# Patient Record
Sex: Male | Born: 1970
Health system: Southern US, Community
[De-identification: ages and names within clinical notes are randomized; demographics above are authoritative.]

## PROBLEM LIST (undated history)

## (undated) DIAGNOSIS — K5790 Diverticulosis of intestine, part unspecified, without perforation or abscess without bleeding: Secondary | ICD-10-CM

## (undated) DIAGNOSIS — T7840XA Allergy, unspecified, initial encounter: Secondary | ICD-10-CM

## (undated) DIAGNOSIS — K5900 Constipation, unspecified: Secondary | ICD-10-CM

## (undated) DIAGNOSIS — J45909 Unspecified asthma, uncomplicated: Secondary | ICD-10-CM

## (undated) DIAGNOSIS — K219 Gastro-esophageal reflux disease without esophagitis: Secondary | ICD-10-CM

## (undated) DIAGNOSIS — H269 Unspecified cataract: Secondary | ICD-10-CM

## (undated) DIAGNOSIS — Z83719 Family history of colon polyps, unspecified: Secondary | ICD-10-CM

## (undated) DIAGNOSIS — M1990 Unspecified osteoarthritis, unspecified site: Secondary | ICD-10-CM

## (undated) DIAGNOSIS — G709 Myoneural disorder, unspecified: Secondary | ICD-10-CM

## (undated) DIAGNOSIS — G5790 Unspecified mononeuropathy of unspecified lower limb: Secondary | ICD-10-CM

## (undated) DIAGNOSIS — Z8371 Family history of colonic polyps: Secondary | ICD-10-CM

## (undated) HISTORY — PX: EXTERNAL EAR SURGERY: SHX627

## (undated) HISTORY — PX: ANTERIOR CRUCIATE LIGAMENT REPAIR: SHX115

## (undated) HISTORY — DX: Family history of colonic polyps: Z83.71

## (undated) HISTORY — PX: OTHER SURGICAL HISTORY: SHX169

## (undated) HISTORY — DX: Myoneural disorder, unspecified: G70.9

## (undated) HISTORY — DX: Family history of colon polyps, unspecified: Z83.719

## (undated) HISTORY — DX: Allergy, unspecified, initial encounter: T78.40XA

## (undated) HISTORY — DX: Unspecified mononeuropathy of unspecified lower limb: G57.90

## (undated) HISTORY — PX: KNEE ARTHROSCOPY: SHX127

## (undated) HISTORY — DX: Unspecified cataract: H26.9

## (undated) HISTORY — DX: Unspecified asthma, uncomplicated: J45.909

## (undated) HISTORY — DX: Unspecified osteoarthritis, unspecified site: M19.90

## (undated) HISTORY — PX: COLONOSCOPY: SHX174

## (undated) HISTORY — DX: Gastro-esophageal reflux disease without esophagitis: K21.9

## (undated) HISTORY — DX: Constipation, unspecified: K59.00

## (undated) HISTORY — DX: Diverticulosis of intestine, part unspecified, without perforation or abscess without bleeding: K57.90

---

## 2017-03-27 ENCOUNTER — Ambulatory Visit (INDEPENDENT_AMBULATORY_CARE_PROVIDER_SITE_OTHER)
Admission: RE | Admit: 2017-03-27 | Discharge: 2017-03-27 | Disposition: A | Discharge status: Home / Self Care | Payer: No Typology Code available for payment source | Source: Ambulatory Visit | Attending: Family Medicine | Admitting: Family Medicine

## 2017-03-27 ENCOUNTER — Ambulatory Visit (INDEPENDENT_AMBULATORY_CARE_PROVIDER_SITE_OTHER): Payer: No Typology Code available for payment source | Admitting: Family Medicine

## 2017-03-27 ENCOUNTER — Encounter: Payer: Self-pay | Admitting: Family Medicine

## 2017-03-27 VITALS — BP 120/88 | HR 73 | Ht 73.0 in | Wt 244.0 lb

## 2017-03-27 DIAGNOSIS — M549 Dorsalgia, unspecified: Secondary | ICD-10-CM | POA: Diagnosis not present

## 2017-03-27 DIAGNOSIS — L309 Dermatitis, unspecified: Secondary | ICD-10-CM | POA: Insufficient documentation

## 2017-03-27 DIAGNOSIS — J45909 Unspecified asthma, uncomplicated: Secondary | ICD-10-CM | POA: Insufficient documentation

## 2017-03-27 DIAGNOSIS — M5416 Radiculopathy, lumbar region: Secondary | ICD-10-CM | POA: Diagnosis not present

## 2017-03-27 MED ORDER — GABAPENTIN 100 MG PO CAPS: 200.0000 mg | ORAL_CAPSULE | Freq: Every day | ORAL | 3 refills | Status: DC

## 2017-03-27 MED ORDER — KETOROLAC TROMETHAMINE 60 MG/2ML IM SOLN
60.0000 mg | Freq: Once | INTRAMUSCULAR | Status: AC
  Administered 2017-03-27: 60 mg via INTRAMUSCULAR

## 2017-03-27 MED ORDER — METHYLPREDNISOLONE ACETATE 80 MG/ML IJ SUSP
80.0000 mg | Freq: Once | INTRAMUSCULAR | Status: AC
  Administered 2017-03-27: 80 mg via INTRAMUSCULAR

## 2017-03-27 MED ORDER — PREDNISONE 50 MG PO TABS: 50.0000 mg | ORAL_TABLET | Freq: Every day | ORAL | 0 refills | Status: DC

## 2017-03-27 NOTE — Patient Instructions (Addendum)
Good to see you  My cocktail. 2 injection today  Ice 20 minutes 2 times daily. Usually after activity and before bed. Prednisone daily for 5 days starting tomorrow.  Gabapentin 200mg  at night  Back xrays today  Lets do Wed at 1245 or 1pm next week (ok to double book just show this to the front)

## 2017-03-27 NOTE — Progress Notes (Signed)
[name] [place], [place] [address] Phone: [phone] Subjective:    I'm seeing this patient by the request  of:    CC: Low back pain  [id number]  Kyle [name] is a 47 y.o. male coming in for chronic back pain. He has been told that he has DDD and bone spurs in his cervical spine. For the past week the right calf is sore and his foot will go numb. He thought that his truck was placing his back in an odd position. He does have hip and back pain. He has tried warm baths, Toradol, and prednisone.     Patient did have x-rays done today that were independently visualized by me.  Showed moderate degenerative disc disease at L3 through L5.    Past Medical History:  Diagnosis Date  . Arthritis   . Asthma   . GERD (gastroesophageal reflux disease)     Social History   Socioeconomic History  . Marital status: Single    Spouse name: Not on file  . Number of children: Not on file  . Years of education: Not on file  . Highest education level: Not on file  Social Needs  . Financial resource strain: Not on file  . Food insecurity - worry: Not on file  . Food insecurity - inability: Not on file  . Transportation needs - medical: Not on file  . Transportation needs - non-medical: Not on file  Occupational History  . Not on file  Tobacco Use  . Smoking status: Not on file  Substance and Sexual Activity  . Alcohol use: Not on file  . Drug use: Not on file  . Sexual activity: Not on file  Other Topics Concern  . Not on file  Social History Narrative  . Not on file   Allergies  Allergen Reactions  . Azithromycin Other (See Comments)    Possible elevated LFTs Elevated liver enzymes Possible elevated LFTs   . Cyclosporine Hives  . Meperidine Hives  . Penicillins Swelling    Neck swelling    No family history on file.  Family history of potential congenital spinal stenosis   Past medical history, social, surgical and family  history all reviewed in electronic medical record.  No pertanent information unless stated regarding to the chief complaint.   Review of Systems:Review of systems updated and as accurate as of 03/27/17  No headache, visual changes, nausea, vomiting, diarrhea, constipation, dizziness, abdominal pain, skin rash, fevers, chills, night sweats, weight loss, swollen lymph nodes, body aches, joint swelling, muscle aches, chest pain, shortness of breath, mood changes.   Objective  Blood pressure 120/88, pulse 73, height 6\' 1"  (1.854 m), weight 244 lb (110.7 kg), SpO2 94 %. Systems examined below as of 03/27/17   General: No apparent distress alert and oriented x3 mood and affect normal, dressed appropriately.  HEENT: Pupils equal, extraocular movements intact  Respiratory: Patient's speak in full sentences and does not appear short of breath  Cardiovascular: No lower extremity edema, non tender, no erythema  Skin: Warm dry intact with no signs of infection or rash on extremities or on axial skeleton.  Abdomen: Soft nontender  Neuro: Cranial nerves II through XII are intact, neurovascularly intact in all extremities with 2+ DTRs and 2+ pulses.  Lymph: No lymphadenopathy of posterior or anterior cervical chain or axillae bilaterally.  Gait normal with good balance and coordination.  MSK:  Non tender with full range of motion and  good stability and symmetric strength and tone of shoulders, elbows, wrist, hip, knee and ankles bilaterally.   Back Exam:  Inspection: Mild loss of lordosis Motion: Flexion 40 deg, Extension 25 deg, Side Bending to 45 deg bilaterally,  Rotation to 45 deg bilaterally  SLR laying: Mild positive right and 30 degrees XSLR laying: Negative  Palpable tenderness: Tenderness to palpation over the right sacroiliac joint. FABER: negative. Sensory change: Gross sensation intact to all lumbar and sacral dermatomes.  Reflexes: 2+ at both patellar tendons, 2+ at achilles tendons,  Babinski's downgoing.  Strength at foot  Plantar-flexion: 4/5 on the right compared to the contralateral side.  Dorsi-flexion: 5/5 Eversion: 5/5 Inversion: 5/5  Leg strength  Quad: 5/5 Hamstring: 5/5 Hip flexor: 5/5 Hip abductors: 5/5  Gait unremarkable.    Impression and Recommendations:     This case required medical decision making of moderate complexity.      Note: This dictation was prepared with Dragon dictation along with smaller phrase technology. Any transcriptional errors that result from this process are unintentional.

## 2017-03-27 NOTE — Assessment & Plan Note (Signed)
Patient does have more of a lumbar radiculopathy.  Discussed with patient.  Given Toradol and Depo-Medrol today.  Prednisone given for the next 5 days.  Discussed icing regimen and home exercises.  Follow-up in 1 week.  Worsening symptoms consider advanced imaging.  Improvement patient could be a candidate for osteopathic manipulation.

## 2017-04-01 ENCOUNTER — Ambulatory Visit (INDEPENDENT_AMBULATORY_CARE_PROVIDER_SITE_OTHER): Payer: No Typology Code available for payment source | Admitting: Family Medicine

## 2017-04-01 ENCOUNTER — Encounter: Payer: Self-pay | Admitting: Family Medicine

## 2017-04-01 DIAGNOSIS — M5416 Radiculopathy, lumbar region: Secondary | ICD-10-CM | POA: Diagnosis not present

## 2017-04-01 DIAGNOSIS — M999 Biomechanical lesion, unspecified: Secondary | ICD-10-CM | POA: Diagnosis not present

## 2017-04-01 MED ORDER — CYCLOBENZAPRINE HCL 5 MG PO TABS: 5.0000 mg | ORAL_TABLET | Freq: Three times a day (TID) | ORAL | 3 refills | Status: DC | PRN

## 2017-04-01 NOTE — Progress Notes (Signed)
error 

## 2017-04-01 NOTE — Assessment & Plan Note (Signed)
Decision today to treat with OMT was based on Physical Exam  After verbal consent patient was treated with HVLA, ME, FPR techniques in cervical, thoracic, lumbar and sacral areas  Patient tolerated the procedure well with improvement in symptoms  Patient given exercises, stretches and lifestyle modifications  See medications in patient instructions if given  Patient will follow up in 3-4 weeks  

## 2017-04-01 NOTE — Progress Notes (Signed)
[name] [place], [place] [address] Phone: [phone] Subjective:     CC: Back pain follow-up  [id number]  Kyle [name] is a 47 y.o. male coming in with complaint of neck pain.  Was found to have some mild lumbar radiculopathy.  Patient was given prednisone as well as gabapentin.  Patient states doing much better but still having radicular symptoms down the leg.  Patient states on the right has noticed that it is not as frequent is not as severe.  Still seems to be in a flexed position that makes it worse.  Still fatigue at the end of the day seems to make it worse as well.    X-rays taken.  Did have degenerative disc disease from L3 through L5.  Past Medical History:  Diagnosis Date  . Arthritis   . Asthma   . GERD (gastroesophageal reflux disease)    Past Surgical History:  Procedure Laterality Date  . ANTERIOR CRUCIATE LIGAMENT REPAIR    . EXTERNAL EAR SURGERY    . KNEE ARTHROSCOPY     Social History   Socioeconomic History  . Marital status: Single    Spouse name: None  . Number of children: None  . Years of education: None  . Highest education level: None  Social Needs  . Financial resource strain: None  . Food insecurity - worry: None  . Food insecurity - inability: None  . Transportation needs - medical: None  . Transportation needs - non-medical: None  Occupational History  . None  Tobacco Use  . Smoking status: None  Substance and Sexual Activity  . Alcohol use: None  . Drug use: None  . Sexual activity: None  Other Topics Concern  . None  Social History Narrative  . None   Allergies  Allergen Reactions  . Azithromycin Other (See Comments)    Possible elevated LFTs Elevated liver enzymes Possible elevated LFTs   . Cyclosporine Hives  . Meperidine Hives  . Penicillins Swelling    Neck swelling    History reviewed. No pertinent family history.  Some family history of congenital  stenosis   Past medical history, social, surgical and family history all reviewed in electronic medical record.  No pertanent information unless stated regarding to the chief complaint.   Review of Systems:Review of systems updated and as accurate as of 04/01/17  No headache, visual changes, nausea, vomiting, diarrhea, constipation, dizziness, abdominal pain, skin rash, fevers, chills, night sweats, weight loss, swollen lymph nodes, body aches, joint swelling, muscle aches, chest pain, shortness of breath, mood changes.   Objective  Blood pressure 118/82, pulse 72, height 6\' 1"  (1.854 m), weight 245 lb (111.1 kg), SpO2 96 %. Systems examined below as of 04/01/17   General: No apparent distress alert and oriented x3 mood and affect normal, dressed appropriately.  HEENT: Pupils equal, extraocular movements intact  Respiratory: Patient's speak in full sentences and does not appear short of breath  Cardiovascular: No lower extremity edema, non tender, no erythema  Skin: Warm dry intact with no signs of infection or rash on extremities or on axial skeleton.  Abdomen: Soft nontender  Neuro: Cranial nerves II through XII are intact, neurovascularly intact in all extremities with 2+ DTRs and 2+ pulses.  Lymph: No lymphadenopathy of posterior or anterior cervical chain or axillae bilaterally.  Gait normal with good balance and coordination.  MSK:  Non tender with full range of motion and good stability and symmetric strength  and tone of shoulders, elbows, wrist, hip, knee and ankles bilaterally.  Back Exam:  Inspection: Mild loss of lordosis Motion: Flexion 40 deg, Extension 20 deg, Side Bending to 35 deg bilaterally,  Rotation to 45 deg bilaterally  SLR laying: Mild positive on the right XSLR laying: Negative  Palpable tenderness: Tender to palpation the paraspinal musculature of lumbar spine right greater than left. FABER: negative. Sensory change: Gross sensation intact to all lumbar and  sacral dermatomes.  Reflexes: 2+ at both patellar tendons, 2+ at achilles tendons, Babinski's downgoing.  Strength at foot  Plantar-flexion: 5/5 Dorsi-flexion: 5/5 Eversion: 5/5 Inversion: 5/5  Leg strength  Quad: 5/5 Hamstring: 5/5 Hip flexor: 5/5 Hip abductors: 5/5  Gait unremarkable.   Osteopathic findings C2 flexed rotated and side bent right C7 flexed rotated and side bent left T3 extended rotated and side bent right inhaled third rib T5 extended rotated and side bent left L3 flexed rotated and side bent right Sacrum right on right    Impression and Recommendations:     This case required medical decision making of moderate complexity.      Note: This dictation was prepared with Dragon dictation along with smaller phrase technology. Any transcriptional errors that result from this process are unintentional.

## 2017-04-01 NOTE — Patient Instructions (Signed)
Good to see you  Kyle [name] is your friend.  Exercises 3 times a week.  Give the bowling one week  Gabapentin 200mg  at night Refilled flexeril  See me again in 3-4 weeks

## 2017-04-01 NOTE — Assessment & Plan Note (Signed)
Patient did have more of a lumbar radiculopathy.  His doing significantly better.  Still a mild positive straight leg test.  Attempted osteopathic manipulation.  Patient has responded to this fairly well today.  We discussed icing regimen, home exercises, can gabapentin.  Patient will follow up with me again in 3-4 weeks

## 2017-04-26 NOTE — Progress Notes (Signed)
[name] [place], [place] [address] Phone: [phone] Subjective:     CC: Back pain follow-up  [id number]  Kyle [name] is a 47 y.o. male coming in with complaint of back pain.  Past medical history significant for back surgeries.  Patient was having more radicular symptoms.  Responded well to prednisone and then osteopathic manipulation.  Patient has been doing home exercises.  Patient states continues to improve.  Patient states that he is 90% better.  Still if he sits a long amount of time has some intermittent pain that goes down the leg.  Nothing severe as what it was before.  Increasing activity.      Past Medical History:  Diagnosis Date  . Arthritis   . Asthma   . GERD (gastroesophageal reflux disease)    Past Surgical History:  Procedure Laterality Date  . ANTERIOR CRUCIATE LIGAMENT REPAIR    . EXTERNAL EAR SURGERY    . KNEE ARTHROSCOPY     Social History   Socioeconomic History  . Marital status: Single    Spouse name: None  . Number of children: None  . Years of education: None  . Highest education level: None  Social Needs  . Financial resource strain: None  . Food insecurity - worry: None  . Food insecurity - inability: None  . Transportation needs - medical: None  . Transportation needs - non-medical: None  Occupational History  . None  Tobacco Use  . Smoking status: Never Smoker  . Smokeless tobacco: Never Used  Substance and Sexual Activity  . Alcohol use: None  . Drug use: None  . Sexual activity: None  Other Topics Concern  . None  Social History Narrative  . None   Allergies  Allergen Reactions  . Azithromycin Other (See Comments)    Possible elevated LFTs Elevated liver enzymes Possible elevated LFTs   . Cyclosporine Hives  . Meperidine Hives  . Penicillins Swelling    Neck swelling    No family history on file.  No family history of autoimmune   Past medical history, social,  surgical and family history all reviewed in electronic medical record.  No pertanent information unless stated regarding to the chief complaint.   Review of Systems:Review of systems updated and as accurate as of 04/27/17  No headache, visual changes, nausea, vomiting, diarrhea, constipation, dizziness, abdominal pain, skin rash, fevers, chills, night sweats, weight loss, swollen lymph nodes, body aches, joint swelling, muscle aches, chest pain, shortness of breath, mood changes.   Objective  Blood pressure 118/80, pulse 64, height 6\' 1"  (1.854 m), weight 242 lb (109.8 kg), SpO2 97 %. Systems examined below as of 04/27/17   General: No apparent distress alert and oriented x3 mood and affect normal, dressed appropriately.  HEENT: Pupils equal, extraocular movements intact  Respiratory: Patient's speak in full sentences and does not appear short of breath  Cardiovascular: No lower extremity edema, non tender, no erythema  Skin: Warm dry intact with no signs of infection or rash on extremities or on axial skeleton.  Abdomen: Soft nontender  Neuro: Cranial nerves II through XII are intact, neurovascularly intact in all extremities with 2+ DTRs and 2+ pulses.  Lymph: No lymphadenopathy of posterior or anterior cervical chain or axillae bilaterally.  Gait normal with good balance and coordination.  MSK:  Non tender with full range of motion and good stability and symmetric strength and tone of shoulders, elbows, wrist, hip, knee and ankles  bilaterally.  Back Exam:  Inspection: Mild loss of lordosis Motion: Flexion 45 deg, Extension 20 deg, Side Bending to 30 deg bilaterally,  Rotation to 40 deg bilaterally  SLR laying: Negative  XSLR laying: Negative  Palpable tenderness: Tender to palpation in the paraspinal musculature. FABER: negative. Sensory change: Gross sensation intact to all lumbar and sacral dermatomes.  Reflexes: 2+ at both patellar tendons, 2+ at achilles tendons, Babinski's  downgoing.  Strength at foot  Plantar-flexion: 5/5 Dorsi-flexion: 5/5 Eversion: 5/5 Inversion: 5/5  Leg strength  Quad: 5/5 Hamstring: 5/5 Hip flexor: 5/5 Hip abductors: 4/5 but symmetric Gait unremarkable.  Osteopathic findings C2 flexed rotated and side bent right T5 extended rotated and side bent left L2 flexed rotated and side bent left  Sacrum right on right     Impression and Recommendations:     This case required medical decision making of moderate complexity.      Note: This dictation was prepared with Dragon dictation along with smaller phrase technology. Any transcriptional errors that result from this process are unintentional.

## 2017-04-27 ENCOUNTER — Ambulatory Visit (INDEPENDENT_AMBULATORY_CARE_PROVIDER_SITE_OTHER): Payer: No Typology Code available for payment source | Admitting: Family Medicine

## 2017-04-27 ENCOUNTER — Encounter: Payer: Self-pay | Admitting: Family Medicine

## 2017-04-27 VITALS — BP 118/80 | HR 64 | Ht 73.0 in | Wt 242.0 lb

## 2017-04-27 DIAGNOSIS — M5416 Radiculopathy, lumbar region: Secondary | ICD-10-CM | POA: Diagnosis not present

## 2017-04-27 DIAGNOSIS — M999 Biomechanical lesion, unspecified: Secondary | ICD-10-CM

## 2017-04-27 NOTE — Assessment & Plan Note (Signed)
Decision today to treat with OMT was based on Physical Exam  After verbal consent patient was treated with HVLA, ME, FPR techniques in cervical, thoracic, lumbar and sacral areas  Patient tolerated the procedure well with improvement in symptoms  Patient given exercises, stretches and lifestyle modifications  See medications in patient instructions if given  Patient will follow up in 4 weeks 

## 2017-04-27 NOTE — Assessment & Plan Note (Signed)
Much improved at this time.  Discussed icing regimen and home exercises.  Discussed which activities to do which wants to avoid.  Patient is to increase activity slowly over the course the next several days.  Follow-up with me again in 4-6 weeks.

## 2017-04-27 NOTE — Patient Instructions (Signed)
6-8 week

## 2017-06-17 NOTE — Progress Notes (Signed)
[name] [place], [place] [address] Phone: [phone] Subjective:     CC: Back pain follow-up  [name]  Kyle [name] is a 47 y.o. male coming in with complaint of back pain.  Patient was found to have more of a lumbar radiculopathy.  Patient wanted to continue conservative therapy.  Seems to be doing okay.  Still has intermittent pain that seems to go down the right leg from time to time.  Nothing severe though.  Patient states that the back pain can sometimes get fairly aggravated O.     Past Medical History:  Diagnosis Date  . Arthritis   . Asthma   . GERD (gastroesophageal reflux disease)    Past Surgical History:  Procedure Laterality Date  . ANTERIOR CRUCIATE LIGAMENT REPAIR    . EXTERNAL EAR SURGERY    . KNEE ARTHROSCOPY     Social History   Socioeconomic History  . Marital status: Single    Spouse name: Not on file  . Number of children: Not on file  . Years of education: Not on file  . Highest education level: Not on file  Occupational History  . Not on file  Social Needs  . Financial resource strain: Not on file  . Food insecurity:    Worry: Not on file    Inability: Not on file  . Transportation needs:    Medical: Not on file    Non-medical: Not on file  Tobacco Use  . Smoking status: Never Smoker  . Smokeless tobacco: Never Used  Substance and Sexual Activity  . Alcohol use: Not on file  . Drug use: Not on file  . Sexual activity: Not on file  Lifestyle  . Physical activity:    Days per week: Not on file    Minutes per session: Not on file  . Stress: Not on file  Relationships  . Social connections:    Talks on phone: Not on file    Gets together: Not on file    Attends religious service: Not on file    Active member of club or organization: Not on file    Attends meetings of clubs or organizations: Not on file    Relationship status: Not on file  Other Topics Concern  . Not on file    Social History Narrative  . Not on file   Allergies  Allergen Reactions  . Azithromycin Other (See Comments)    Possible elevated LFTs Elevated liver enzymes Possible elevated LFTs   . Cyclosporine Hives  . Meperidine Hives  . Penicillins Swelling    Neck swelling    No family history on file.   Past medical history, social, surgical and family history all reviewed in electronic medical record.  No pertanent information unless stated regarding to the chief complaint.   Review of Systems:Review of systems updated and as accurate as of 06/18/17  No headache, visual changes, nausea, vomiting, diarrhea, constipation, dizziness, abdominal pain, skin rash, fevers, chills, night sweats, weight loss, swollen lymph nodes, body aches, joint swelling,  chest pain, shortness of breath, mood changes.  Positive muscle aches  Objective  Blood pressure 130/84, pulse 83, height 6\' 1"  (1.854 m), weight 243 lb (110.2 kg), SpO2 96 %. Systems examined below as of 06/18/17   General: No apparent distress alert and oriented x3 mood and affect normal, dressed appropriately.  HEENT: Pupils equal, extraocular movements intact  Respiratory: Patient's speak in full sentences and does not  appear short of breath  Cardiovascular: No lower extremity edema, non tender, no erythema  Skin: Warm dry intact with no signs of infection or rash on extremities or on axial skeleton.  Abdomen: Soft nontender  Neuro: Cranial nerves II through XII are intact, neurovascularly intact in all extremities with 2+ DTRs and 2+ pulses.  Lymph: No lymphadenopathy of posterior or anterior cervical chain or axillae bilaterally.  Gait normal with good balance and coordination.  MSK:  Non tender with full range of motion and good stability and symmetric strength and tone of shoulders, elbows, wrist, hip, knee and ankles bilaterally.  Back Exam:  Inspection: Unremarkable  Motion: Flexion 45 deg, Extension 45 deg, Side Bending to 45  deg bilaterally,  Rotation to 45 deg bilaterally  SLR laying: Negative  XSLR laying: Negative  Palpable tenderness: Mild tenderness to palpation in the paraspinal musculature on the right side. FABER: negative. Sensory change: Gross sensation intact to all lumbar and sacral dermatomes.  Reflexes: 2+ at both patellar tendons, 2+ at achilles tendons, Babinski's downgoing.  Strength at foot  Plantar-flexion: 5/5 Dorsi-flexion: 5/5 Eversion: 5/5 Inversion: 5/5  Leg strength  Quad: 5/5 Hamstring: 5/5 Hip flexor: 5/5 Hip abductors: 5/5  Gait unremarkable.  Osteopathic findings C2 flexed rotated and side bent right C4 flexed rotated and side bent left C7 flexed rotated and side bent left T3 extended rotated and side bent right inhaled third rib T5 extended rotated and side bent left L2 flexed rotated and side bent right Sacrum right on right     Impression and Recommendations:     This case required medical decision making of moderate complexity.      Note: This dictation was prepared with Dragon dictation along with smaller phrase technology. Any transcriptional errors that result from this process are unintentional.

## 2017-06-18 ENCOUNTER — Ambulatory Visit (INDEPENDENT_AMBULATORY_CARE_PROVIDER_SITE_OTHER): Payer: No Typology Code available for payment source | Admitting: Family Medicine

## 2017-06-18 ENCOUNTER — Encounter: Payer: Self-pay | Admitting: Family Medicine

## 2017-06-18 VITALS — BP 130/84 | HR 83 | Ht 73.0 in | Wt 243.0 lb

## 2017-06-18 DIAGNOSIS — M5416 Radiculopathy, lumbar region: Secondary | ICD-10-CM | POA: Diagnosis not present

## 2017-06-18 DIAGNOSIS — M999 Biomechanical lesion, unspecified: Secondary | ICD-10-CM

## 2017-06-18 NOTE — Assessment & Plan Note (Signed)
Lumbar radiculopathy.  Discussed icing regimen and home exercises.  Discussed which activities to do which wants to avoid.  Increase activity as tolerated.  Patient will follow up with me again in 6 weeks

## 2017-06-18 NOTE — Assessment & Plan Note (Signed)
Decision today to treat with OMT was based on Physical Exam  After verbal consent patient was treated with HVLA, ME, FPR techniques in cervical, thoracic, lumbar and sacral areas  Patient tolerated the procedure well with improvement in symptoms  Patient given exercises, stretches and lifestyle modifications  See medications in patient instructions if given  Patient will follow up in 6 weeks 

## 2017-06-18 NOTE — Patient Instructions (Signed)
6 weeks Consider piriformis

## 2017-07-27 NOTE — Progress Notes (Signed)
[name] [address] [place], [place] [address] Phone: [phone] Subjective:    I'm seeing this patient by the request  of:    CC: Back pain follow-up  EPP:[id number]  Kyle [name] is a 47 y.o. male coming in with complaint of back pain.  Was initially seen with more of a lumbar radiculopathy.  Has done physical therapy manipulation.  Has been making progress.  Still actually he sits for too long unfortunately worsening pain.  Would like to consider the piriformis injection but not today.  Patient denies any weakness but states that it seems to be worsening slowly over the course the last several days.     Past Medical History:  Diagnosis Date  . Arthritis   . Asthma   . GERD (gastroesophageal reflux disease)    Past Surgical History:  Procedure Laterality Date  . ANTERIOR CRUCIATE LIGAMENT REPAIR    . EXTERNAL EAR SURGERY    . KNEE ARTHROSCOPY     Social History   Socioeconomic History  . Marital status: Single    Spouse name: Not on file  . Number of children: Not on file  . Years of education: Not on file  . Highest education level: Not on file  Occupational History  . Not on file  Social Needs  . Financial resource strain: Not on file  . Food insecurity:    Worry: Not on file    Inability: Not on file  . Transportation needs:    Medical: Not on file    Non-medical: Not on file  Tobacco Use  . Smoking status: Never Smoker  . Smokeless tobacco: Never Used  Substance and Sexual Activity  . Alcohol use: Not on file  . Drug use: Not on file  . Sexual activity: Not on file  Lifestyle  . Physical activity:    Days per week: Not on file    Minutes per session: Not on file  . Stress: Not on file  Relationships  . Social connections:    Talks on phone: Not on file    Gets together: Not on file    Attends religious service: Not on file    Active member of club or organization: Not on file    Attends meetings of clubs or  organizations: Not on file    Relationship status: Not on file  Other Topics Concern  . Not on file  Social History Narrative  . Not on file   Allergies  Allergen Reactions  . Azithromycin Other (See Comments)    Possible elevated LFTs Elevated liver enzymes Possible elevated LFTs   . Cyclosporine Hives  . Meperidine Hives  . Penicillins Swelling    Neck swelling    No family history on file.   Past medical history, social, surgical and family history all reviewed in electronic medical record.  No pertanent information unless stated regarding to the chief complaint.   Review of Systems:Review of systems updated and as accurate as of 07/28/17  No headache, visual changes, nausea, vomiting, diarrhea, constipation, dizziness, abdominal pain, skin rash, fevers, chills, night sweats, weight loss, swollen lymph nodes, body aches, joint swelling, muscle aches, chest pain, shortness of breath, mood changes.   Objective  Blood pressure 120/80, pulse 73, height 6\' 1"  (1.854 m), weight 245 lb (111.1 kg), SpO2 96 %. Systems examined below as of 07/28/17   General: No apparent distress alert and oriented x3 mood and affect normal, dressed appropriately.  HEENT: Pupils  equal, extraocular movements intact  Respiratory: Patient's speak in full sentences and does not appear short of breath  Cardiovascular: No lower extremity edema, non tender, no erythema  Skin: Warm dry intact with no signs of infection or rash on extremities or on axial skeleton.  Abdomen: Soft nontender  Neuro: Cranial nerves II through XII are intact, neurovascularly intact in all extremities with 2+ DTRs and 2+ pulses.  Lymph: No lymphadenopathy of posterior or anterior cervical chain or axillae bilaterally.  Gait normal with good balance and coordination.  MSK:  Non tender with full range of motion and good stability and symmetric strength and tone of shoulders, elbows, wrist, hip, knee and ankles bilaterally.  Back  Exam:  Inspection: Mild loss of lordosis Motion: Flexion 45 deg, Extension 25 deg, Side Bending to 35 deg bilaterally,  Rotation to 45 deg bilaterally  SLR laying: Negative significant tightness in the hamstring more than usual XSLR laying: Negative  Palpable tenderness: Tender to palpation the paraspinal musculature lumbar spine right greater than left. FABER: Positive Faber. Sensory change: Gross sensation intact to all lumbar and sacral dermatomes.  Reflexes: 2+ at both patellar tendons, 2+ at achilles tendons, Babinski's downgoing.  Strength at foot  Plantar-flexion: 5/5 Dorsi-flexion: 5/5 Eversion: 5/5 Inversion: 5/5  Leg strength  Quad: 5/5 Hamstring: 5/5 Hip flexor: 5/5 Hip abductors: 5/5  Gait unremarkable.  Osteopathic findings  T6 extended rotated and side bent left L2 flexed rotated and side bent right Sacrum right on right     Impression and Recommendations:     This case required medical decision making of moderate complexity.      Note: This dictation was prepared with Dragon dictation along with smaller phrase technology. Any transcriptional errors that result from this process are unintentional.

## 2017-07-28 ENCOUNTER — Encounter: Payer: Self-pay | Admitting: Family Medicine

## 2017-07-28 ENCOUNTER — Ambulatory Visit (INDEPENDENT_AMBULATORY_CARE_PROVIDER_SITE_OTHER): Payer: No Typology Code available for payment source | Admitting: Family Medicine

## 2017-07-28 VITALS — BP 120/80 | HR 73 | Ht 73.0 in | Wt 245.0 lb

## 2017-07-28 DIAGNOSIS — M999 Biomechanical lesion, unspecified: Secondary | ICD-10-CM

## 2017-07-28 DIAGNOSIS — M5416 Radiculopathy, lumbar region: Secondary | ICD-10-CM

## 2017-07-28 NOTE — Assessment & Plan Note (Addendum)
Decision today to treat with OMT was based on Physical Exam  After verbal consent patient was treated with HVLA, ME, FPR techniques in  thoracic, lumbar and sacral areas  Patient tolerated the procedure well with improvement in symptoms  Patient given exercises, stretches and lifestyle modifications  See medications in patient instructions if given  Patient will follow up in 4-8 weeks 

## 2017-07-28 NOTE — Patient Instructions (Signed)
Good to see you  See me again in 3-6 weeks for the injection

## 2017-07-28 NOTE — Assessment & Plan Note (Signed)
Patient does not have true radicular signs at this time but patient does have some tightness in the [place] which is not new.  Consider possible injection for diagnostic and hopefully therapeutic purposes.  We discussed which activities to do which wants to avoid.  Discussed continuing the same medications including gabapentin and is been using intermittent.  Patient will follow-up with me again in 4 to 8 weeks

## 2017-08-04 NOTE — Progress Notes (Signed)
[name] [address] [place], [place] [address] Phone: [phone] Subjective:     CC: Back pain and right leg pain follow-up  ZTI:[id number]  Kyle [name] is a 47 y.o. male coming in with complaint of back pain.  Patient did have what appeared to be more of a lumbar radiculopathy.  Also having some symptoms consistent with the piriformis.  Having more pain in the buttocks.  States that the pain is going down the leg as well again.  Significant tightness.  Patient states that the pain seems to be worsening.  Has responded somewhat to manipulation in the past.     Past Medical History:  Diagnosis Date  . Arthritis   . Asthma   . GERD (gastroesophageal reflux disease)    Past Surgical History:  Procedure Laterality Date  . ANTERIOR CRUCIATE LIGAMENT REPAIR    . EXTERNAL EAR SURGERY    . KNEE ARTHROSCOPY     Social History   Socioeconomic History  . Marital status: Single    Spouse name: Not on file  . Number of children: Not on file  . Years of education: Not on file  . Highest education level: Not on file  Occupational History  . Not on file  Social Needs  . Financial resource strain: Not on file  . Food insecurity:    Worry: Not on file    Inability: Not on file  . Transportation needs:    Medical: Not on file    Non-medical: Not on file  Tobacco Use  . Smoking status: Never Smoker  . Smokeless tobacco: Never Used  Substance and Sexual Activity  . Alcohol use: Not on file  . Drug use: Not on file  . Sexual activity: Not on file  Lifestyle  . Physical activity:    Days per week: Not on file    Minutes per session: Not on file  . Stress: Not on file  Relationships  . Social connections:    Talks on phone: Not on file    Gets together: Not on file    Attends religious service: Not on file    Active member of club or organization: Not on file    Attends meetings of clubs or organizations: Not on file    Relationship status:  Not on file  Other Topics Concern  . Not on file  Social History Narrative  . Not on file   Allergies  Allergen Reactions  . Azithromycin Other (See Comments)    Possible elevated LFTs Elevated liver enzymes Possible elevated LFTs   . Cyclosporine Hives  . Meperidine Hives  . Penicillins Swelling    Neck swelling    No family history on file.   Past medical history, social, surgical and family history all reviewed in electronic medical record.  No pertanent information unless stated regarding to the chief complaint.   Review of Systems:Review of systems updated and as accurate as of 08/05/17  No headache, visual changes, nausea, vomiting, diarrhea, constipation, dizziness, abdominal pain, skin rash, fevers, chills, night sweats, weight loss, swollen lymph nodes, body aches, joint swelling, muscle aches, chest pain, shortness of breath, mood changes.   Objective  Blood pressure 100/70, pulse 81, height 6\' 1"  (1.854 m), weight 245 lb (111.1 kg), SpO2 96 %. Systems examined below as of 08/05/17   General: No apparent distress alert and oriented x3 mood and affect normal, dressed appropriately.  HEENT: Pupils equal, extraocular movements intact  Respiratory: Patient's  speak in full sentences and does not appear short of breath  Cardiovascular: No lower extremity edema, non tender, no erythema  Skin: Warm dry intact with no signs of infection or rash on extremities or on axial skeleton.  Abdomen: Soft nontender  Neuro: Cranial nerves II through XII are intact, neurovascularly intact in all extremities with 2+ DTRs and 2+ pulses.  Lymph: No lymphadenopathy of posterior or anterior cervical chain or axillae bilaterally.  Gait normal with good balance and coordination.  MSK:  Non tender with full range of motion and good stability and symmetric strength and tone of shoulders, elbows, wrist, hip, knee and ankles bilaterally.  Back exam shows that patient does have a positive Corky Sox on  the right.  Mild tightness with a straight leg test as well.  Pain over the piriformis as well as over the paraspinal musculature lumbar spine.  Procedure: Real-time Ultrasound Guided Injection of right piriformis tendon sheath Device: GE Logiq Q7 Ultrasound guided injection is preferred based studies that show increased duration, increased effect, greater accuracy, decreased procedural pain, increased response rate, and decreased cost with ultrasound guided versus blind injection.  Verbal informed consent obtained.  Time-out conducted.  Noted no overlying erythema, induration, or other signs of local infection.  Skin prepped in a sterile fashion.  Local anesthesia: Topical Ethyl chloride.  With sterile technique and under real time ultrasound guidance: With a 21-gauge 2 inch needle was injected with 1 cc of 0.5% Marcaine and 1 cc of Kenalog 40 mg/mL into the tendon sheath. Completed without difficulty  Pain immediately resolved suggesting accurate placement of the medication.  Advised to call if fevers/chills, erythema, induration, drainage, or persistent bleeding.  Images permanently stored and available for review in the ultrasound unit.  Impression: Technically successful ultrasound guided injection.    Impression and Recommendations:     This case required medical decision making of moderate complexity.      Note: This dictation was prepared with Dragon dictation along with smaller phrase technology. Any transcriptional errors that result from this process are unintentional.

## 2017-08-05 ENCOUNTER — Ambulatory Visit (INDEPENDENT_AMBULATORY_CARE_PROVIDER_SITE_OTHER): Payer: No Typology Code available for payment source | Admitting: Family Medicine

## 2017-08-05 ENCOUNTER — Encounter: Payer: Self-pay | Admitting: Family Medicine

## 2017-08-05 ENCOUNTER — Ambulatory Visit: Payer: Self-pay

## 2017-08-05 VITALS — BP 100/70 | HR 81 | Ht 73.0 in | Wt 245.0 lb

## 2017-08-05 DIAGNOSIS — M25551 Pain in right hip: Secondary | ICD-10-CM

## 2017-08-05 DIAGNOSIS — G5701 Lesion of sciatic nerve, right lower limb: Secondary | ICD-10-CM | POA: Diagnosis not present

## 2017-08-05 NOTE — Assessment & Plan Note (Signed)
Patient given injection.  Discussed icing regimen and home exercises.  Discussed which activities of doing which wants to avoid.  Patient is to increase activity.  Patient is responded to manipulation and will continue that in the future as well.

## 2017-08-05 NOTE — Patient Instructions (Signed)
Good to see you  Ice is your friend and may need it in 6 hours.  Should kick in soon and everyday for 2 weeks should get better Take it easy today and a little tomorrow See me again in 3 weeks

## 2017-08-11 ENCOUNTER — Ambulatory Visit: Payer: No Typology Code available for payment source | Admitting: Family Medicine

## 2017-08-25 ENCOUNTER — Ambulatory Visit: Payer: No Typology Code available for payment source | Admitting: Family Medicine

## 2017-10-22 NOTE — Progress Notes (Signed)
[name] [address] [place], [place] [address] Phone: [phone] Subjective:      CC: Back pain follow-up  CXK:[id number]  Kyle [name] is a 47 y.o. male coming in with complaint of back pain.  Initially was seen by me and had more of a lumbar radiculopathy as well as a piriformis syndrome on the right side.  Has been responding fairly well to conservative therapy including gabapentin at night intermittently.  Patient did initially respond well to the prednisone as well.  Has had a history of back surgery.  Patient though has responded well to manipulation.  Patient states that he has been having an increase in the radicular pain on the right side. Constant pain throughout the day.      Past Medical History:  Diagnosis Date  . Arthritis   . Asthma   . GERD (gastroesophageal reflux disease)    Past Surgical History:  Procedure Laterality Date  . ANTERIOR CRUCIATE LIGAMENT REPAIR    . EXTERNAL EAR SURGERY    . KNEE ARTHROSCOPY     Social History   Socioeconomic History  . Marital status: Single    Spouse name: Not on file  . Number of children: Not on file  . Years of education: Not on file  . Highest education level: Not on file  Occupational History  . Not on file  Social Needs  . Financial resource strain: Not on file  . Food insecurity:    Worry: Not on file    Inability: Not on file  . Transportation needs:    Medical: Not on file    Non-medical: Not on file  Tobacco Use  . Smoking status: Never Smoker  . Smokeless tobacco: Never Used  Substance and Sexual Activity  . Alcohol use: Not on file  . Drug use: Not on file  . Sexual activity: Not on file  Lifestyle  . Physical activity:    Days per week: Not on file    Minutes per session: Not on file  . Stress: Not on file  Relationships  . Social connections:    Talks on phone: Not on file    Gets together: Not on file    Attends religious service: Not on file    Active  member of club or organization: Not on file    Attends meetings of clubs or organizations: Not on file    Relationship status: Not on file  Other Topics Concern  . Not on file  Social History Narrative  . Not on file   Allergies  Allergen Reactions  . Azithromycin Other (See Comments)    Possible elevated LFTs Elevated liver enzymes Possible elevated LFTs   . Cyclosporine Hives  . Meperidine Hives  . Penicillins Swelling    Neck swelling    No family history on file.   Past medical history, social, surgical and family history all reviewed in electronic medical record.  No pertanent information unless stated regarding to the chief complaint.   Review of Systems:Review of systems updated and as accurate as of 10/23/17  No headache, visual changes, nausea, vomiting, diarrhea, constipation, dizziness, abdominal pain, skin rash, fevers, chills, night sweats, weight loss, swollen lymph nodes, body aches, joint swelling, muscle aches, chest pain, shortness of breath, mood changes.   Objective  Blood pressure 108/82, pulse 75, height 6\' 1"  (1.854 m), weight 236 lb (107 kg), SpO2 96 %. Systems examined below as of 10/23/17   General: No apparent  distress alert and oriented x3 mood and affect normal, dressed appropriately.  HEENT: Pupils equal, extraocular movements intact  Respiratory: Patient's speak in full sentences and does not appear short of breath  Cardiovascular: No lower extremity edema, non tender, no erythema  Skin: Warm dry intact with no signs of infection or rash on extremities or on axial skeleton.  Abdomen: Soft nontender  Neuro: Cranial nerves II through XII are intact, neurovascularly intact in all extremities with 2+ DTRs and 2+ pulses.  Lymph: No lymphadenopathy of posterior or anterior cervical chain or axillae bilaterally.  Gait normal with good balance and coordination.  MSK:  Non tender with full range of motion and good stability and symmetric strength and  tone of shoulders, elbows, wrist, hip, knee and ankles bilaterally.  Back Exam:  Inspection: Unremarkable  Motion: Flexion 45 deg, Extension 45 deg, Side Bending to 45 deg bilaterally,  Rotation to 45 deg bilaterally  SLR laying: Negative  XSLR laying: Negative  Palpable tenderness: None. FABER: negative. Sensory change: Gross sensation intact to all lumbar and sacral dermatomes.  Reflexes: 2+ at both patellar tendons, 2+ at achilles tendons, Babinski's downgoing.  Strength at foot  Plantar-flexion: 5/5 Dorsi-flexion: 5/5 Eversion: 5/5 Inversion: 5/5  Leg strength  Quad: 5/5 Hamstring: 5/5 Hip flexor: 5/5 Hip abductors: 5/5  Gait unremarkable.  Osteopathic findings C2 flexed rotated and side bent right C4 flexed rotated and side bent left C6 flexed rotated and side bent left T3 extended rotated and side bent right inhaled third rib T9 extended rotated and side bent left L2 flexed rotated and side bent right Sacrum right on right     Impression and Recommendations:     This case required medical decision making of moderate complexity.      Note: This dictation was prepared with Dragon dictation along with smaller phrase technology. Any transcriptional errors that result from this process are unintentional.

## 2017-10-23 ENCOUNTER — Encounter: Payer: Self-pay | Admitting: Family Medicine

## 2017-10-23 ENCOUNTER — Ambulatory Visit (INDEPENDENT_AMBULATORY_CARE_PROVIDER_SITE_OTHER): Payer: No Typology Code available for payment source | Admitting: Family Medicine

## 2017-10-23 VITALS — BP 108/82 | HR 75 | Ht 73.0 in | Wt 236.0 lb

## 2017-10-23 DIAGNOSIS — M999 Biomechanical lesion, unspecified: Secondary | ICD-10-CM

## 2017-10-23 DIAGNOSIS — M5416 Radiculopathy, lumbar region: Secondary | ICD-10-CM | POA: Diagnosis not present

## 2017-10-23 MED ORDER — GABAPENTIN 100 MG PO CAPS: 200.0000 mg | ORAL_CAPSULE | Freq: Every day | ORAL | 3 refills | Status: DC

## 2017-10-23 MED ORDER — PREDNISONE 50 MG PO TABS: 50.0000 mg | ORAL_TABLET | Freq: Every day | ORAL | 0 refills | Status: DC

## 2017-10-23 NOTE — Assessment & Plan Note (Signed)
Stable at present moment.  No significant changes in medications.  Patient does need thumb manipulation every 2 to 3 months and I think this will be beneficial.  Discussed icing regimen and home exercises.  Discussed which activities of doing which was to avoid.  Increase activity slowly.  Follow-up again in 2 to 3 months

## 2017-10-23 NOTE — Assessment & Plan Note (Addendum)
Decision today to treat with OMT was based on Physical Exam  After verbal consent patient was treated with HVLA, ME, FPR techniques in  thoracic, lumbar and sacral areas  Patient tolerated the procedure well with improvement in symptoms  Patient given exercises, stretches and lifestyle modifications  See medications in patient instructions if given  Patient will follow up in 12 weeks 

## 2017-10-23 NOTE — Patient Instructions (Signed)
Good to see you  [name] is your friend.  Stay active Prednisone if it acts up  See me again in 3 weeks for piriformis injection

## 2017-11-05 NOTE — Progress Notes (Signed)
[name] [place], [place] [address] Phone: [phone] Subjective:      CC: Back pain follow-up  URK:[id number]  Kyle [name] is a 47 y.o. male coming in with complaint of back pain. Injection in the hip today. States that he feels tight.  Has had a right-sided piriformis as well.  Discussed with patient again in great length with parents noted that he has been trying to do the exercises regularly but finds it difficult.  Has been doing a lot more yard work recently.    Past Medical History:  Diagnosis Date  . Arthritis   . Asthma   . GERD (gastroesophageal reflux disease)    Past Surgical History:  Procedure Laterality Date  . ANTERIOR CRUCIATE LIGAMENT REPAIR    . EXTERNAL EAR SURGERY    . KNEE ARTHROSCOPY     Social History   Socioeconomic History  . Marital status: Single    Spouse name: Not on file  . Number of children: Not on file  . Years of education: Not on file  . Highest education level: Not on file  Occupational History  . Not on file  Social Needs  . Financial resource strain: Not on file  . Food insecurity:    Worry: Not on file    Inability: Not on file  . Transportation needs:    Medical: Not on file    Non-medical: Not on file  Tobacco Use  . Smoking status: Never Smoker  . Smokeless tobacco: Never Used  Substance and Sexual Activity  . Alcohol use: Not on file  . Drug use: Not on file  . Sexual activity: Not on file  Lifestyle  . Physical activity:    Days per week: Not on file    Minutes per session: Not on file  . Stress: Not on file  Relationships  . Social connections:    Talks on phone: Not on file    Gets together: Not on file    Attends religious service: Not on file    Active member of club or organization: Not on file    Attends meetings of clubs or organizations: Not on file    Relationship status: Not on file  Other Topics Concern  . Not on file  Social History Narrative  .  Not on file   Allergies  Allergen Reactions  . Azithromycin Other (See Comments)    Possible elevated LFTs Elevated liver enzymes Possible elevated LFTs   . Cyclosporine Hives  . Meperidine Hives  . Penicillins Swelling    Neck swelling    No family history on file.  Current Outpatient Medications (Endocrine & Metabolic):  .  predniSONE (DELTASONE) 50 MG tablet, Take 1 tablet (50 mg total) by mouth daily.   Current Outpatient Medications (Respiratory):  .  albuterol (PROAIR HFA) 108 (90 Base) MCG/ACT inhaler, Inhale into the lungs.  Current Outpatient Medications (Analgesics):  .  HYDROcodone-acetaminophen (NORCO) 10-325 MG tablet, Take 1 tablet by mouth every 6 (six) hours as needed. Marland Kitchen  HYDROcodone-acetaminophen (NORCO/VICODIN) 5-325 MG tablet, Take by mouth.   Current Outpatient Medications (Other):  .  cyclobenzaprine (FLEXERIL) 5 MG tablet, Take 1 tablet (5 mg total) by mouth 3 (three) times daily as needed for muscle spasms. Marland Kitchen  gabapentin (NEURONTIN) 100 MG capsule, Take 2 capsules (200 mg total) by mouth at bedtime. .  phentermine 37.5 MG capsule, Take by mouth. .  valACYclovir (VALTREX) 1000 MG tablet, Take by mouth.  Past medical history, social, surgical and family history all reviewed in electronic medical record.  No pertanent information unless stated regarding to the chief complaint.   Review of Systems:  No headache, visual changes, nausea, vomiting, diarrhea, constipation, dizziness, abdominal pain, skin rash, fevers, chills, night sweats, weight loss, swollen lymph nodes, body aches, joint swelling, chest pain, shortness of breath, mood changes.  Positive muscle aches  Objective  Blood pressure 118/80, pulse 72, height 6\' 1"  (1.854 m), weight 237 lb (107.5 kg), SpO2 97 %.    General: No apparent distress alert and oriented x3 mood and affect normal, dressed appropriately.  HEENT: Pupils equal, extraocular movements intact  Respiratory: Patient's speak  in full sentences and does not appear short of breath  Cardiovascular: No lower extremity edema, non tender, no erythema  Skin: Warm dry intact with no signs of infection or rash on extremities or on axial skeleton.  Abdomen: Soft nontender  Neuro: Cranial nerves II through XII are intact, neurovascularly intact in all extremities with 2+ DTRs and 2+ pulses.  Lymph: No lymphadenopathy of posterior or anterior cervical chain or axillae bilaterally.  Gait normal with good balance and coordination.  MSK:  Non tender with full range of motion and good stability and symmetric strength and tone of shoulders, elbows, wrist, hip, knee and ankles bilaterally.  Back Exam:  Inspection: Loss of lordosis Motion: Flexion 35 deg, Extension 35 deg, Side Bending to 35 deg bilaterally,  Rotation to 45 deg bilaterally  SLR laying: Negative  XSLR laying: Negative  Palpable tenderness: Tender to palpation the paraspinal musculature lumbar spine right greater than left. FABER: Tightness right with severe pain over the piriformis. Sensory change: Gross sensation intact to all lumbar and sacral dermatomes.  Reflexes: 2+ at both patellar tendons, 2+ at achilles tendons, Babinski's downgoing.  Strength at foot  Plantar-flexion: 5/5 Dorsi-flexion: 5/5 Eversion: 5/5 Inversion: 5/5  Leg strength  Quad: 5/5 Hamstring: 5/5 Hip flexor: 5/5 Hip abductors: 5/5  Gait unremarkable.    Osteopathic findings  T5 extended rotated and side bent left L2 flexed rotated and side bent right Sacrum right on right  Procedure: Real-time Ultrasound Guided Injection of right piriformis Device: GE Logiq Q7 Ultrasound guided injection is preferred based studies that show increased duration, increased effect, greater accuracy, decreased procedural pain, increased response rate, and decreased cost with ultrasound guided versus blind injection.  Verbal informed consent obtained.  Time-out conducted.  Noted no overlying erythema,  induration, or other signs of local infection.  Skin prepped in a sterile fashion.  Local anesthesia: Topical Ethyl chloride.  With sterile technique and under real time ultrasound guidance: With a 21-gauge 2 inch needle was injected with a total of 1 cc of 0.5% Marcaine and 1 cc of Kenalog 40 mg/mL into the piriformis tendon sheath Completed without difficulty  Pain immediately resolved suggesting accurate placement of the medication.  Advised to call if fevers/chills, erythema, induration, drainage, or persistent bleeding.  Images permanently stored and available for review in the ultrasound unit.  Impression: Technically successful ultrasound guided injection.  Impression and Recommendations:     This case required medical decision making of moderate complexity. The above documentation has been reviewed and is accurate and complete [name], DO       Note: This dictation was prepared with Dragon dictation along with smaller phrase technology. Any transcriptional errors that result from this process are unintentional.

## 2017-11-06 ENCOUNTER — Encounter: Payer: Self-pay | Admitting: Family Medicine

## 2017-11-06 ENCOUNTER — Ambulatory Visit (INDEPENDENT_AMBULATORY_CARE_PROVIDER_SITE_OTHER): Payer: No Typology Code available for payment source | Admitting: Family Medicine

## 2017-11-06 ENCOUNTER — Ambulatory Visit: Payer: Self-pay

## 2017-11-06 VITALS — BP 118/80 | HR 72 | Ht 73.0 in | Wt 237.0 lb

## 2017-11-06 DIAGNOSIS — M999 Biomechanical lesion, unspecified: Secondary | ICD-10-CM

## 2017-11-06 DIAGNOSIS — G5701 Lesion of sciatic nerve, right lower limb: Secondary | ICD-10-CM | POA: Diagnosis not present

## 2017-11-06 DIAGNOSIS — M25551 Pain in right hip: Secondary | ICD-10-CM

## 2017-11-06 NOTE — Assessment & Plan Note (Signed)
Repeat injection given again today.  Discussed icing regimen and home exercise.  Discussed which activities to do which wants to avoid.  If you continue to have trouble consider epidurals.  Follow-up again in 4 to 6 weeks

## 2017-11-06 NOTE — Assessment & Plan Note (Signed)
Decision today to treat with OMT was based on Physical Exam  After verbal consent patient was treated with HVLA, ME, FPR techniques in  thoracic, lumbar and sacral areas  Patient tolerated the procedure well with improvement in symptoms  Patient given exercises, stretches and lifestyle modifications  See medications in patient instructions if given  Patient will follow up in 4-6 weeks 

## 2017-12-26 NOTE — Progress Notes (Signed)
[name] [place] [place], [place] [address] Phone: [phone] Subjective:   Kyle [name], am serving as a scribe for Dr. Hulan Saas.   CC: Back pain follow-up  [id number]  Kyle [name] is a 47 y.o. male coming in with complaint of back pain. Has had increase in pain last couple of days which he attributes to the change in weather. Does have radiating symptoms on left leg.  Has noticed some weakness as well.  Patient was responding well to manipulation previously.  Patient was having more constipation recently as well.  Pain is stopping him from activities at this moment.  Rates it as 9 out of 10.  Patient is wondering what else can be potentially done.      Past Medical History:  Diagnosis Date  . Arthritis   . Asthma   . GERD (gastroesophageal reflux disease)    Past Surgical History:  Procedure Laterality Date  . ANTERIOR CRUCIATE LIGAMENT REPAIR    . EXTERNAL EAR SURGERY    . KNEE ARTHROSCOPY     Social History   Socioeconomic History  . Marital status: Single    Spouse name: Not on file  . Number of children: Not on file  . Years of education: Not on file  . Highest education level: Not on file  Occupational History  . Not on file  Social Needs  . Financial resource strain: Not on file  . Food insecurity:    Worry: Not on file    Inability: Not on file  . Transportation needs:    Medical: Not on file    Non-medical: Not on file  Tobacco Use  . Smoking status: Never Smoker  . Smokeless tobacco: Never Used  Substance and Sexual Activity  . Alcohol use: Not on file  . Drug use: Not on file  . Sexual activity: Not on file  Lifestyle  . Physical activity:    Days per week: Not on file    Minutes per session: Not on file  . Stress: Not on file  Relationships  . Social connections:    Talks on phone: Not on file    Gets together: Not on file    Attends religious service: Not on file    Active member of club  or organization: Not on file    Attends meetings of clubs or organizations: Not on file    Relationship status: Not on file  Other Topics Concern  . Not on file  Social History Narrative  . Not on file   Allergies  Allergen Reactions  . Azithromycin Other (See Comments)    Possible elevated LFTs Elevated liver enzymes Possible elevated LFTs   . Cyclosporine Hives  . Meperidine Hives  . Penicillins Swelling    Neck swelling    [name] family history on file.  Current Outpatient Medications (Endocrine & Metabolic):  .  predniSONE (DELTASONE) 50 MG tablet, Take 1 tablet (50 mg total) by mouth daily.   Current Outpatient Medications (Respiratory):  .  albuterol (PROAIR HFA) 108 (90 Base) MCG/ACT inhaler, Inhale into the lungs.  Current Outpatient Medications (Analgesics):  .  HYDROcodone-acetaminophen (NORCO) 10-325 MG tablet, Take 1 tablet by mouth every 6 (six) hours as needed. Marland Kitchen  HYDROcodone-acetaminophen (NORCO/VICODIN) 5-325 MG tablet, Take by mouth.   Current Outpatient Medications (Other):  .  cyclobenzaprine (FLEXERIL) 5 MG tablet, Take 1 tablet (5 mg total) by mouth 3 (three) times daily as needed for muscle spasms. Marland Kitchen  gabapentin (NEURONTIN) 100 MG capsule, Take 2 capsules (200 mg total) by mouth at bedtime. .  phentermine 37.5 MG capsule, Take by mouth. .  valACYclovir (VALTREX) 1000 MG tablet, Take by mouth.    Past medical history, social, surgical and family history all reviewed in electronic medical record.  [name] pertanent information unless stated regarding to the chief complaint.   Review of Systems:  [name] headache, visual changes, nausea, vomiting, diarrhea, constipation, dizziness, abdominal pain, skin rash, fevers, chills, night sweats, weight loss, swollen lymph nodes, body aches, joint swelling, muscle aches, chest pain, shortness of breath, mood changes.   Objective  Blood pressure 120/82, pulse 69, height 6\' 1"  (1.854 m), weight 243 lb (110.2 kg), SpO2 94  %.   General: [name] apparent distress alert and oriented x3 mood and affect normal, dressed appropriately.  HEENT: Pupils equal, extraocular movements intact  Respiratory: Patient's speak in full sentences and does not appear short of breath  Cardiovascular: [name] lower extremity edema, non tender, [name] erythema  Skin: Warm dry intact with [name] signs of infection or rash on extremities or on axial skeleton.  Abdomen: Soft nontender  Neuro: Cranial nerves II through XII are intact, neurovascularly intact in all extremities with 2+ DTRs and 2+ pulses.  Lymph: [name] lymphadenopathy of posterior or anterior cervical chain or axillae bilaterally.  Gait antalgic MSK:  Non tender with full range of motion and good stability and symmetric strength and tone of shoulders, elbows, wrist, hip, knee and ankles bilaterally.  Back Exam:  Inspection: Loss of lordosis Motion: Flexion 30 deg, Extension 25 deg, Side Bending to 35 deg bilaterally,  Rotation to 35 deg bilaterally  SLR laying: Positive on the left at 20 degrees XSLR laying: Negative  Palpable tenderness: Tender in the paraspinal musculature of the left back.Corky Sox: Positive Faber on the left. Sensory change: Gross sensation intact to all lumbar and sacral dermatomes.  Reflexes: 2+ at both patellar tendons, 2+ at achilles tendons, Babinski's downgoing.  Strength at foot  Plantar-flexion: 4 out of 5 on the left compared to the right   Impression and Recommendations:     This case required medical decision making of moderate complexity. The above documentation has been reviewed and is accurate and complete [name], DO       Note: This dictation was prepared with Dragon dictation along with smaller phrase technology. Any transcriptional errors that result from this process are unintentional.

## 2017-12-28 ENCOUNTER — Encounter: Payer: Self-pay | Admitting: Family Medicine

## 2017-12-28 ENCOUNTER — Ambulatory Visit (INDEPENDENT_AMBULATORY_CARE_PROVIDER_SITE_OTHER): Payer: No Typology Code available for payment source | Admitting: Family Medicine

## 2017-12-28 VITALS — BP 120/82 | HR 69 | Ht 73.0 in | Wt 243.0 lb

## 2017-12-28 DIAGNOSIS — M5416 Radiculopathy, lumbar region: Secondary | ICD-10-CM

## 2017-12-28 NOTE — Patient Instructions (Signed)
Good to see you  Sorry you are hurting We will get MRI of the lumbar done  Call [phone] to schedule After you have MRI I will call you and discuss  May get epidurals and if we do I want to see you again 2-3 weeks after the injection

## 2017-12-28 NOTE — Assessment & Plan Note (Signed)
Worsening at this time.  Discussed posture and ergonomics.  Discussed which activities of doing which wants to avoid.  Patient is having worsening symptoms.  Mild weakness in the left lower extremity at this time.  Patient wanted osteopathic manipulation but with the weakness I do feel that advanced imaging would be more recommended at the moment.  We discussed compression and icing.  We discussed the possibility of an MRI which patient agreed with.  Patient could be a candidate for injections.  Patient will have this done after the MRI we will discuss again.

## 2018-01-01 ENCOUNTER — Ambulatory Visit
Admission: RE | Admit: 2018-01-01 | Discharge: 2018-01-01 | Disposition: A | Discharge status: Home / Self Care | Payer: No Typology Code available for payment source | Source: Ambulatory Visit | Attending: Family Medicine | Admitting: Family Medicine

## 2018-01-01 DIAGNOSIS — M5416 Radiculopathy, lumbar region: Secondary | ICD-10-CM

## 2018-01-06 ENCOUNTER — Encounter: Payer: Self-pay | Admitting: Family Medicine

## 2018-01-06 ENCOUNTER — Ambulatory Visit (INDEPENDENT_AMBULATORY_CARE_PROVIDER_SITE_OTHER): Payer: No Typology Code available for payment source | Admitting: Family Medicine

## 2018-01-06 VITALS — BP 118/84 | HR 73 | Ht 73.0 in | Wt 241.0 lb

## 2018-01-06 DIAGNOSIS — M5416 Radiculopathy, lumbar region: Secondary | ICD-10-CM

## 2018-01-06 DIAGNOSIS — M999 Biomechanical lesion, unspecified: Secondary | ICD-10-CM

## 2018-01-06 NOTE — Patient Instructions (Signed)
Good to see you  Ice when you need it L3/4 epidural  Call [phone]  I am here when you need me but do like to see you again 3 weeks AFTER epidural
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
## 2018-01-06 NOTE — Assessment & Plan Note (Signed)
Decision today to treat with OMT was based on Physical Exam  After verbal consent patient was treated with  ME, FPR techniques in cervical, thoracic, rib lumbar and sacral areas  Patient tolerated the procedure well with improvement in symptoms  Patient given exercises, stretches and lifestyle modifications  See medications in patient instructions if given  Patient will follow up in 4 weeks 

## 2018-01-06 NOTE — Assessment & Plan Note (Signed)
Discussed which activities to do which was to avoid.  Discussed posture and ergonomics.  Epidural ordered today. RTC in 4 weeks or 3 weeks after epidural

## 2018-01-14 ENCOUNTER — Ambulatory Visit
Admission: RE | Admit: 2018-01-14 | Discharge: 2018-01-14 | Disposition: A | Discharge status: Home / Self Care | Payer: No Typology Code available for payment source | Source: Ambulatory Visit | Attending: Family Medicine | Admitting: Family Medicine

## 2018-01-14 DIAGNOSIS — M5416 Radiculopathy, lumbar region: Secondary | ICD-10-CM

## 2018-01-14 MED ORDER — METHYLPREDNISOLONE ACETATE 40 MG/ML INJ SUSP (RADIOLOG
120.0000 mg | Freq: Once | INTRAMUSCULAR | Status: AC
  Administered 2018-01-14: 120 mg via EPIDURAL

## 2018-01-14 MED ORDER — IOPAMIDOL (ISOVUE-M 200) INJECTION 41%
1.0000 mL | Freq: Once | INTRAMUSCULAR | Status: AC
  Administered 2018-01-14: 1 mL via EPIDURAL

## 2018-01-14 NOTE — Discharge Instructions (Signed)

## 2018-01-22 ENCOUNTER — Telehealth: Payer: Self-pay

## 2018-01-22 NOTE — Telephone Encounter (Signed)
Copied from [place] [id number]. Topic: Appointment Scheduling - Scheduling Inquiry for Clinic >> Jan 21, 2018 12:12 PM Scherrie Gerlach wrote: Reason for CRM: pt states dr Tamala Julian wanted him to see him 3 weeks after his epidural.  Pt had the epidural on 11/7. Pt states he needs appt 12/06 or 12/10.  These are the only days he can come, but no appts available.  Please advise if ok to work in. thanks

## 2018-01-22 NOTE — Telephone Encounter (Signed)
Called patient to schedule and phone continued to ring. Dr. Tamala Julian is not here today. Looks like patient wants to schedule to talk about progress post epidural. Ok to schedule either day.

## 2018-02-11 NOTE — Progress Notes (Signed)
[name] [place], [place] [address] Phone: [phone] Subjective:   Kyle [name], am serving as a scribe for Dr. Hulan [name].  I'm seeing this patient by the request  of:    CC: Back pain follow-up  [id number]  Kyle [name] is a 47 y.o. male coming in with complaint of back pain. Did have relief but Monday he developed pain radiating down both legs again. Is better when it is warmer.  Patient states that the epidural that he was given last month is improving.  And on average about 90% better.  Today little tight and would state that he 75% better.  Only had the radiation 1 day since the epidural.  Mild increase in discomfort with the piriformis though noted.     Past Medical History:  Diagnosis Date  . Arthritis   . Asthma   . GERD (gastroesophageal reflux disease)    Past Surgical History:  Procedure Laterality Date  . ANTERIOR CRUCIATE LIGAMENT REPAIR    . EXTERNAL EAR SURGERY    . KNEE ARTHROSCOPY     Social History   Socioeconomic History  . Marital status: Single    Spouse name: Not on file  . Number of children: Not on file  . Years of education: Not on file  . Highest education level: Not on file  Occupational History  . Not on file  Social Needs  . Financial resource strain: Not on file  . Food insecurity:    Worry: Not on file    Inability: Not on file  . Transportation needs:    Medical: Not on file    Non-medical: Not on file  Tobacco Use  . Smoking status: Never Smoker  . Smokeless tobacco: Never Used  Substance and Sexual Activity  . Alcohol use: Not on file  . Drug use: Not on file  . Sexual activity: Not on file  Lifestyle  . Physical activity:    Days per week: Not on file    Minutes per session: Not on file  . Stress: Not on file  Relationships  . Social connections:    Talks on phone: Not on file    Gets together: Not on file    Attends religious service: Not on file    Active  member of club or organization: Not on file    Attends meetings of clubs or organizations: Not on file    Relationship status: Not on file  Other Topics Concern  . Not on file  Social History Narrative  . Not on file   Allergies  Allergen Reactions  . Azithromycin Other (See Comments)    Possible elevated LFTs Elevated liver enzymes Possible elevated LFTs   . Cyclosporine Hives  . Meperidine Hives  . Penicillins Swelling    Neck swelling    [name] family history on file.  Current Outpatient Medications (Endocrine & Metabolic):  .  predniSONE (DELTASONE) 50 MG tablet, Take 1 tablet (50 mg total) by mouth daily.   Current Outpatient Medications (Respiratory):  .  albuterol (PROAIR HFA) 108 (90 Base) MCG/ACT inhaler, Inhale into the lungs.  Current Outpatient Medications (Analgesics):  .  HYDROcodone-acetaminophen (NORCO) 10-325 MG tablet, Take 1 tablet by mouth every 6 (six) hours as needed. Marland Kitchen  HYDROcodone-acetaminophen (NORCO/VICODIN) 5-325 MG tablet, Take by mouth.   Current Outpatient Medications (Other):  .  cyclobenzaprine (FLEXERIL) 5 MG tablet, Take 1 tablet (5 mg total) by mouth 3 (three) times daily  as needed for muscle spasms. Marland Kitchen  gabapentin (NEURONTIN) 100 MG capsule, Take 2 capsules (200 mg total) by mouth at bedtime. .  phentermine 37.5 MG capsule, Take by mouth. .  valACYclovir (VALTREX) 1000 MG tablet, Take by mouth.    Past medical history, social, surgical and family history all reviewed in electronic medical record.  [name] pertanent information unless stated regarding to the chief complaint.   Review of Systems:  [name] headache, visual changes, nausea, vomiting, diarrhea, constipation, dizziness, abdominal pain, skin rash, fevers, chills, night sweats, weight loss, swollen lymph nodes, body aches, joint swelling, muscle aches, chest pain, shortness of breath, mood changes.   Objective  There were [name] vitals taken for this visit. Systems examined below as of      General: [name] apparent distress alert and oriented x3 mood and affect normal, dressed appropriately.  HEENT: Pupils equal, extraocular movements intact  Respiratory: Patient's speak in full sentences and does not appear short of breath  Cardiovascular: [name] lower extremity edema, non tender, [name] erythema  Skin: Warm dry intact with [name] signs of infection or rash on extremities or on axial skeleton.  Abdomen: Soft nontender  Neuro: Cranial nerves II through XII are intact, neurovascularly intact in all extremities with 2+ DTRs and 2+ pulses.  Lymph: [name] lymphadenopathy of posterior or anterior cervical chain or axillae bilaterally.  Gait normal with good balance and coordination.  MSK:  Non tender with full range of motion and good stability and symmetric strength and tone of shoulders, elbows, wrist, hip, knee and ankles bilaterally.  Back Exam:  Inspection: Unremarkable  Motion: Flexion 40 deg, Extension 25 deg, Side Bending to 35 deg bilaterally, Rotation to 35 deg bilaterally  SLR laying: Negative  XSLR laying: Negative  Palpable tenderness: Tender to palpation of the paraspinal musculature lumbar spine right greater than left. FABER: negative. Sensory change: Gross sensation intact to all lumbar and sacral dermatomes.  Reflexes: 2+ at both patellar tendons, 2+ at achilles tendons, Babinski's downgoing.  Strength at foot  Plantar-flexion: 5/5 Dorsi-flexion: 5/5 Eversion: 5/5 Inversion: 5/5  Leg strength  Quad: 5/5 Hamstring: 5/5 Hip flexor: 5/5 Hip abductors: 5/5  Gait unremarkable.  Osteopathic findings C2 flexed rotated and side bent right  T6 extended rotated and side bent left L2 flexed rotated and side bent right Sacrum right on right     Impression and Recommendations:     This case required medical decision making of moderate complexity. The above documentation has been reviewed and is accurate and complete Kyle Pulley, DO       Note: This dictation was prepared  with Dragon dictation along with smaller phrase technology. Any transcriptional errors that result from this process are unintentional.

## 2018-02-12 ENCOUNTER — Ambulatory Visit (INDEPENDENT_AMBULATORY_CARE_PROVIDER_SITE_OTHER): Payer: PRIVATE HEALTH INSURANCE | Admitting: Family Medicine

## 2018-02-12 ENCOUNTER — Encounter: Payer: Self-pay | Admitting: Family Medicine

## 2018-02-12 VITALS — BP 104/78 | HR 73 | Ht 73.0 in | Wt 245.0 lb

## 2018-02-12 DIAGNOSIS — M5416 Radiculopathy, lumbar region: Secondary | ICD-10-CM | POA: Diagnosis not present

## 2018-02-12 DIAGNOSIS — M999 Biomechanical lesion, unspecified: Secondary | ICD-10-CM

## 2018-02-12 MED ORDER — CYCLOBENZAPRINE HCL 5 MG PO TABS: 5.0000 mg | ORAL_TABLET | Freq: Three times a day (TID) | ORAL | 3 refills | Status: DC | PRN

## 2018-02-12 MED ORDER — GABAPENTIN 100 MG PO CAPS: 200.0000 mg | ORAL_CAPSULE | Freq: Every day | ORAL | 3 refills | Status: DC

## 2018-02-12 MED ORDER — PREDNISONE 50 MG PO TABS: 50.0000 mg | ORAL_TABLET | Freq: Every day | ORAL | 0 refills | Status: DC

## 2018-02-12 NOTE — Assessment & Plan Note (Signed)
Decision today to treat with OMT was based on Physical Exam  After verbal consent patient was treated with HVLA, ME, FPR techniques in cervical, thoracic, lumbar and sacral areas  Patient tolerated the procedure well with improvement in symptoms  Patient given exercises, stretches and lifestyle modifications  See medications in patient instructions if given  Patient will follow up in 6 weeks 

## 2018-02-12 NOTE — Patient Instructions (Signed)
Good to see you  Ice is your friend See me again in 8 weeks

## 2018-02-12 NOTE — Assessment & Plan Note (Signed)
Significant improvement after epidural patient feeling much better overall.  States that has had to take the Flexeril and gabapentin one time.  Patient is doing much better overall.  Increase activity discussed which activities of doing which wants to avoid.  Patient will follow-up with me again in 6 weeks.

## 2018-03-21 DIAGNOSIS — R300 Dysuria: Secondary | ICD-10-CM | POA: Diagnosis not present

## 2018-03-21 DIAGNOSIS — R350 Frequency of micturition: Secondary | ICD-10-CM | POA: Diagnosis not present

## 2018-03-22 ENCOUNTER — Telehealth: Payer: Self-pay | Admitting: Emergency Medicine

## 2018-03-22 NOTE — Telephone Encounter (Signed)
Pt is wanting to know if he can get the ICD Codes and CPT Procedure code for all of his appts from Jan 1st, 2019-Oct 31st 2019 and Nov 1st- Current. He will come back by another day this week to pick up. Thanks.

## 2018-03-23 NOTE — Telephone Encounter (Signed)
Did you get an answer on this?

## 2018-03-23 NOTE — Telephone Encounter (Signed)
Patient needs to complete an access form and submit to medical records.

## 2018-03-30 DIAGNOSIS — R350 Frequency of micturition: Secondary | ICD-10-CM | POA: Diagnosis not present

## 2018-04-05 ENCOUNTER — Encounter: Payer: Self-pay | Admitting: Family Medicine

## 2018-04-05 ENCOUNTER — Ambulatory Visit (INDEPENDENT_AMBULATORY_CARE_PROVIDER_SITE_OTHER): Payer: BLUE CROSS/BLUE SHIELD | Admitting: Family Medicine

## 2018-04-05 VITALS — BP 126/90 | HR 76 | Ht 73.0 in | Wt 230.0 lb

## 2018-04-05 DIAGNOSIS — M999 Biomechanical lesion, unspecified: Secondary | ICD-10-CM

## 2018-04-05 DIAGNOSIS — M5416 Radiculopathy, lumbar region: Secondary | ICD-10-CM | POA: Diagnosis not present

## 2018-04-05 NOTE — Patient Instructions (Signed)
Good to see you   

## 2018-04-05 NOTE — Assessment & Plan Note (Signed)
Decision today to treat with OMT was based on Physical Exam  After verbal consent patient was treated with HVLA, ME, FPR techniques in  thoracic, lumbar and sacral areas  Patient tolerated the procedure well with improvement in symptoms  Patient given exercises, stretches and lifestyle modifications  See medications in patient instructions if given  Patient will follow up in 8 weeks 

## 2018-04-05 NOTE — Progress Notes (Signed)
[name] [place], [place] [address] Phone: [phone] Subjective:    I Kyle [name] am serving as a Education administrator for Dr. Hulan Saas.    CC: Back pain follow-up  [id number]  Kyle [name] is a 48 y.o. male coming in with complaint of back pain. States that his back is doing well. Patient since then has not had any significant pain.  Has dropped 15 pounds and feels like that has made a significant improvement at the moment.    Past Medical History:  Diagnosis Date  . Arthritis   . Asthma   . GERD (gastroesophageal reflux disease)    Past Surgical History:  Procedure Laterality Date  . ANTERIOR CRUCIATE LIGAMENT REPAIR    . EXTERNAL EAR SURGERY    . KNEE ARTHROSCOPY     Social History   Socioeconomic History  . Marital status: Single    Spouse name: Not on file  . Number of children: Not on file  . Years of education: Not on file  . Highest education level: Not on file  Occupational History  . Not on file  Social Needs  . Financial resource strain: Not on file  . Food insecurity:    Worry: Not on file    Inability: Not on file  . Transportation needs:    Medical: Not on file    Non-medical: Not on file  Tobacco Use  . Smoking status: Never Smoker  . Smokeless tobacco: Never Used  Substance and Sexual Activity  . Alcohol use: Not on file  . Drug use: Not on file  . Sexual activity: Not on file  Lifestyle  . Physical activity:    Days per week: Not on file    Minutes per session: Not on file  . Stress: Not on file  Relationships  . Social connections:    Talks on phone: Not on file    Gets together: Not on file    Attends religious service: Not on file    Active member of club or organization: Not on file    Attends meetings of clubs or organizations: Not on file    Relationship status: Not on file  Other Topics Concern  . Not on file  Social History Narrative  . Not on file   Allergies  Allergen  Reactions  . Azithromycin Other (See Comments)    Possible elevated LFTs Elevated liver enzymes Possible elevated LFTs   . Cyclosporine Hives  . Meperidine Hives  . Penicillins Swelling    Neck swelling    No family history on file.  Current Outpatient Medications (Endocrine & Metabolic):  .  predniSONE (DELTASONE) 50 MG tablet, Take 1 tablet (50 mg total) by mouth daily.   Current Outpatient Medications (Respiratory):  .  albuterol (PROAIR HFA) 108 (90 Base) MCG/ACT inhaler, Inhale into the lungs.  Current Outpatient Medications (Analgesics):  .  HYDROcodone-acetaminophen (NORCO) 10-325 MG tablet, Take 1 tablet by mouth every 6 (six) hours as needed. Marland Kitchen  HYDROcodone-acetaminophen (NORCO/VICODIN) 5-325 MG tablet, Take by mouth.   Current Outpatient Medications (Other):  .  cyclobenzaprine (FLEXERIL) 5 MG tablet, Take 1 tablet (5 mg total) by mouth 3 (three) times daily as needed for muscle spasms. Marland Kitchen  gabapentin (NEURONTIN) 100 MG capsule, Take 2 capsules (200 mg total) by mouth at bedtime. .  phentermine 37.5 MG capsule, Take by mouth. .  valACYclovir (VALTREX) 1000 MG tablet, Take by mouth.    Past medical history, social,  surgical and family history all reviewed in electronic medical record.  No pertanent information unless stated regarding to the chief complaint.   Review of Systems:  No headache, visual changes, nausea, vomiting, diarrhea, constipation, dizziness, abdominal pain, skin rash, fevers, chills, night sweats, weight loss, swollen lymph nodes, body aches, joint swelling, muscle aches, chest pain, shortness of breath, mood changes.   Objective  There were no vitals taken for this visit. Systems examined below as of    General: No apparent distress alert and oriented x3 mood and affect normal, dressed appropriately.  HEENT: Pupils equal, extraocular movements intact  Respiratory: Patient's speak in full sentences and does not appear short of breath    Cardiovascular: No lower extremity edema, non tender, no erythema  Skin: Warm dry intact with no signs of infection or rash on extremities or on axial skeleton.  Abdomen: Soft nontender  Neuro: Cranial nerves II through XII are intact, neurovascularly intact in all extremities with 2+ DTRs and 2+ pulses.  Lymph: No lymphadenopathy of posterior or anterior cervical chain or axillae bilaterally.  Gait normal with good balance and coordination.  MSK:  Non tender with full range of motion and good stability and symmetric strength and tone of shoulders, elbows, wrist, hip, knee and ankles bilaterally.  Neck: Inspection loss of lordosis. No palpable stepoffs. Mild limited range of motion Grip strength and sensation normal in bilateral hands Strength good C4 to T1 distribution No sensory change to C4 to T1 Negative Hoffman sign bilaterally Reflexes normal  Back Exam:  Inspection: Unremarkable  Motion: Flexion 35 deg, Extension 25 deg, Side Bending to 25 deg bilaterally, Rotation to 35 deg bilaterally  SLR laying: Negative  XSLR laying: Negative  Palpable tenderness: Tender to palpation of the paraspinal musculature lumbar spine right greater than left. FABER: Tightness noted bilaterally. Sensory change: Gross sensation intact to all lumbar and sacral dermatomes.  Reflexes: 2+ at both patellar tendons, 2+ at achilles tendons, Babinski's downgoing.  Strength at foot  Plantar-flexion: 5/5 Dorsi-flexion: 5/5 Eversion: 5/5 Inversion: 5/5  Leg strength  Quad: 5/5 Hamstring: 5/5 Hip flexor: 5/5 Hip abductors: 5/5  Gait unremarkable.  Osteopathic findings T9 extended rotated and side bent left L1 flexed rotated and side bent right Sacrum right on right    Impression and Recommendations:     This case required medical decision making of moderate complexity. The above documentation has been reviewed and is accurate and complete [name], DO       Note: This dictation was  prepared with Dragon dictation along with smaller phrase technology. Any transcriptional errors that result from this process are unintentional.

## 2018-04-05 NOTE — Assessment & Plan Note (Signed)
Longer having radicular symptoms.  Discussed icing regimen and home exercise.  Discussed which activities to do which wants to avoid.  Increase activity slowly over the course of next 5 days.  Follow-up with me again in 2 to 3 months

## 2018-05-12 DIAGNOSIS — Z125 Encounter for screening for malignant neoplasm of prostate: Secondary | ICD-10-CM | POA: Diagnosis not present

## 2018-05-12 DIAGNOSIS — J4520 Mild intermittent asthma, uncomplicated: Secondary | ICD-10-CM | POA: Diagnosis not present

## 2018-05-12 DIAGNOSIS — Z13220 Encounter for screening for lipoid disorders: Secondary | ICD-10-CM | POA: Diagnosis not present

## 2018-05-12 DIAGNOSIS — Z8739 Personal history of other diseases of the musculoskeletal system and connective tissue: Secondary | ICD-10-CM | POA: Diagnosis not present

## 2018-05-25 ENCOUNTER — Ambulatory Visit (INDEPENDENT_AMBULATORY_CARE_PROVIDER_SITE_OTHER): Payer: BLUE CROSS/BLUE SHIELD | Admitting: Family Medicine

## 2018-05-25 ENCOUNTER — Other Ambulatory Visit: Payer: Self-pay

## 2018-05-25 VITALS — BP 124/76 | Ht 73.0 in | Wt 225.0 lb

## 2018-05-25 DIAGNOSIS — M5416 Radiculopathy, lumbar region: Secondary | ICD-10-CM

## 2018-05-25 DIAGNOSIS — L219 Seborrheic dermatitis, unspecified: Secondary | ICD-10-CM | POA: Diagnosis not present

## 2018-05-25 DIAGNOSIS — M999 Biomechanical lesion, unspecified: Secondary | ICD-10-CM | POA: Diagnosis not present

## 2018-05-25 DIAGNOSIS — J4520 Mild intermittent asthma, uncomplicated: Secondary | ICD-10-CM | POA: Diagnosis not present

## 2018-05-25 DIAGNOSIS — Z0000 Encounter for general adult medical examination without abnormal findings: Secondary | ICD-10-CM | POA: Diagnosis not present

## 2018-05-25 DIAGNOSIS — M5441 Lumbago with sciatica, right side: Secondary | ICD-10-CM | POA: Diagnosis not present

## 2018-05-25 NOTE — Assessment & Plan Note (Signed)
Decision today to treat with OMT was based on Physical Exam  After verbal consent patient was treated with HVLA, ME, FPR techniques in  thoracic, lumbar and sacral areas  Patient tolerated the procedure well with improvement in symptoms  Patient given exercises, stretches and lifestyle modifications  See medications in patient instructions if given  Patient will follow up in 6-12 weeks 

## 2018-05-25 NOTE — Progress Notes (Signed)
[name], Kyle [name] Phone: [phone] Subjective:   Kyle [name], am serving as a scribe for Dr. Hulan Saas.   CC: Back pain follow-up  [id number]  Kyle [name] is a 48 y.o. male coming in with complaint of back pain. He has been feeling better since last visit. [name] new injuries to his back since last visit. Is here for OMT to manage his back pain.  Patient has been doing relatively well.  Has mild tightness.  [name] radiation of the legs, [name] numbness or weakness.  Feels like he is making progress.  Still having difficulty losing weight though.      Past Medical History:  Diagnosis Date  . Arthritis   . Asthma   . GERD (gastroesophageal reflux disease)    Past Surgical History:  Procedure Laterality Date  . ANTERIOR CRUCIATE LIGAMENT REPAIR    . EXTERNAL EAR SURGERY    . KNEE ARTHROSCOPY     Social History   Socioeconomic History  . Marital status: Single    Spouse name: Not on file  . Number of children: Not on file  . Years of education: Not on file  . Highest education level: Not on file  Occupational History  . Not on file  Social Needs  . Financial resource strain: Not on file  . Food insecurity:    Worry: Not on file    Inability: Not on file  . Transportation needs:    Medical: Not on file    Non-medical: Not on file  Tobacco Use  . Smoking status: Never Smoker  . Smokeless tobacco: Never Used  Substance and Sexual Activity  . Alcohol use: Not on file  . Drug use: Not on file  . Sexual activity: Not on file  Lifestyle  . Physical activity:    Days per week: Not on file    Minutes per session: Not on file  . Stress: Not on file  Relationships  . Social connections:    Talks on phone: Not on file    Gets together: Not on file    Attends religious service: Not on file    Active member of club or organization: Not on file    Attends meetings of clubs or organizations: Not on file    Relationship status: Not on file  Other Topics Concern  . Not on file  Social History Narrative  . Not on file   Allergies  Allergen Reactions  . Azithromycin Other (See Comments)    Possible elevated LFTs Elevated liver enzymes Possible elevated LFTs   . Cyclosporine Hives  . Meperidine Hives  . Penicillins Swelling    Neck swelling    [name] family history on file.  [name] family history of autoimmune  Current Outpatient Medications (Endocrine & Metabolic):  .  predniSONE (DELTASONE) 50 MG tablet, Take 1 tablet (50 mg total) by mouth daily.   Current Outpatient Medications (Respiratory):  .  albuterol (PROAIR HFA) 108 (90 Base) MCG/ACT inhaler, Inhale into the lungs.  Current Outpatient Medications (Analgesics):  .  HYDROcodone-acetaminophen (NORCO) 10-325 MG tablet, Take 1 tablet by mouth every 6 (six) hours as needed. Marland Kitchen  HYDROcodone-acetaminophen (NORCO/VICODIN) 5-325 MG tablet, Take by mouth.   Current Outpatient Medications (Other):  .  cyclobenzaprine (FLEXERIL) 5 MG tablet, Take 1 tablet (5 mg total) by mouth 3 (three) times daily as needed for muscle spasms. Marland Kitchen  gabapentin (NEURONTIN) 100 MG capsule, Take 2 capsules (  200 mg total) by mouth at bedtime. .  phentermine 37.5 MG capsule, Take by mouth. .  valACYclovir (VALTREX) 1000 MG tablet, Take by mouth.    Past medical history, social, surgical and family history all reviewed in electronic medical record.  [name] pertanent information unless stated regarding to the chief complaint.   Review of Systems:  [name] headache, visual changes, nausea, vomiting, diarrhea, constipation, dizziness, abdominal pain, skin rash, fevers, chills, night sweats, weight loss, swollen lymph nodes, body aches, joint swelling, muscle aches, chest pain, shortness of breath, mood changes.   Objective  Blood pressure 124/76, height 6\' 1"  (1.854 m), weight 225 lb (102.1 kg). Systems examined below as of    General: [name] apparent distress alert and  oriented x3 mood and affect normal, dressed appropriately.  HEENT: Pupils equal, extraocular movements intact  Respiratory: Patient's speak in full sentences and does not appear short of breath  Cardiovascular: [name] lower extremity edema, non tender, [name] erythema  Skin: Warm dry intact with [name] signs of infection or rash on extremities or on axial skeleton.  Abdomen: Soft nontender  Neuro: Cranial nerves II through XII are intact, neurovascularly intact in all extremities with 2+ DTRs and 2+ pulses.  Lymph: [name] lymphadenopathy of posterior or anterior cervical chain or axillae bilaterally.  Gait normal with good balance and coordination.  MSK:  Non tender with full range of motion and good stability and symmetric strength and tone of shoulders, elbows, wrist, hip, knee and ankles bilaterally.  Back Exam:  Inspection: Loss of lordosis Motion: Flexion 45 deg, Extension 30 deg, Side Bending to 35 deg bilaterally,  Rotation to 30 deg bilaterally  SLR laying: Negative  XSLR laying: Negative  Palpable tenderness: Tender to palpation of paraspinal musculature lumbar spine left greater than right. FABER: Tightness bilaterally. Sensory change: Gross sensation intact to all lumbar and sacral dermatomes.  Reflexes: 2+ at both patellar tendons, 2+ at achilles tendons, Babinski's downgoing.  Strength at foot  Plantar-flexion: 5/5 Dorsi-flexion: 5/5 Eversion: 5/5 Inversion: 5/5  Leg strength  Quad: 5/5 Hamstring: 5/5 Hip flexor: 5/5 Hip abductors: 5/5    Osteopathic findings  T9 extended rotated and side bent left L2 flexed rotated and side bent right Sacrum right on right     Impression and Recommendations:     This case required medical decision making of moderate complexity. The above documentation has been reviewed and is accurate and complete [name], DO       Note: This dictation was prepared with Dragon dictation along with smaller phrase technology. Any transcriptional errors  that result from this process are unintentional.

## 2018-05-25 NOTE — Patient Instructions (Signed)
Great to see you  Kyle [name] is yoru friend Stay aactive

## 2018-05-25 NOTE — Assessment & Plan Note (Signed)
Likely no radiculopathy at this time.  Encourage patient to continue the gabapentin, has Norco for breakthrough pain, does not need any refill of the prednisone.  Discussed posture ergonomics and hip abductor strengthening.  Follow-up again in 6 to 12 weeks

## 2018-07-20 ENCOUNTER — Ambulatory Visit (AMBULATORY_SURGERY_CENTER): Payer: Self-pay | Admitting: *Deleted

## 2018-07-20 ENCOUNTER — Ambulatory Visit: Payer: No Typology Code available for payment source | Admitting: Family Medicine

## 2018-07-20 ENCOUNTER — Other Ambulatory Visit: Payer: Self-pay

## 2018-07-20 ENCOUNTER — Encounter: Payer: Self-pay | Admitting: *Deleted

## 2018-07-20 VITALS — Ht 73.0 in | Wt 220.0 lb

## 2018-07-20 DIAGNOSIS — Z1211 Encounter for screening for malignant neoplasm of colon: Secondary | ICD-10-CM

## 2018-07-20 MED ORDER — NA SULFATE-K SULFATE-MG SULF 17.5-3.13-1.6 GM/177ML PO SOLN: 1.0000 | Freq: Once | ORAL | 0 refills | Status: AC

## 2018-07-20 NOTE — Progress Notes (Signed)
No egg or soy allergy known to patient  No issues with past sedation with any surgeries  or procedures, no intubation problems  diet pills per patient on phentermine- will stop 10 days prior No home 02 use per patient  No blood thinners per patient  Pt states  issues with constipation- uses Miralax daily still has issues - will do a 2 day Suprep miralax prep   No A fib or A flutter  EMMI video sent to pt's e mail - pt declined   Suprep sample Lot [id number]  Exp 04/2020 As directed

## 2018-07-21 NOTE — Progress Notes (Signed)
[name] [address], [place] [address] Phone: [phone] Subjective:   I Kyle [name] am serving as a Education administrator for Dr. Hulan [name].   CC: back pain follow up   AQT:[id number]  Kyle [name] is a 48 y.o. male coming in with complaint of back pain. States last night was horrible. Left sided hip pain.  Patient has had a right-sided piriformis syndrome and feels like it is contributing again.  Patient did have an injection back in August of last year that seemed to help significantly.  In addition of this patient is having more of a left-sided hip pain.  Points to the lateral aspect.  Waking him up at night.  Has been doing a lot more yard work recently.  Was on a ladder for significant amount of time the day before.  Feels that I can contribute.  Likely no significant radicular symptoms at the time.  Has responded well to manipulation.     Past Medical History:  Diagnosis Date  . Allergy   . Arthritis   . Asthma   . Cataract    small right eye   . Constipation   . Diverticulosis   . Family history of colonic polyps    dad and sister   . GERD (gastroesophageal reflux disease)   . Neuromuscular disorder ([place])    spine  . Neuropathy of foot    right and mild   Past Surgical History:  Procedure Laterality Date  . ANTERIOR CRUCIATE LIGAMENT REPAIR    . COLONOSCOPY    . EXTERNAL EAR SURGERY    . KNEE ARTHROSCOPY    . lasik     Social History   Socioeconomic History  . Marital status: Single    Spouse name: Not on file  . Number of children: Not on file  . Years of education: Not on file  . Highest education level: Not on file  Occupational History  . Not on file  Social Needs  . Financial resource strain: Not on file  . Food insecurity:    Worry: Not on file    Inability: Not on file  . Transportation needs:    Medical: Not on file    Non-medical: Not on file  Tobacco Use  . Smoking status: Former Research scientist (life sciences)  . Smokeless  tobacco: Never Used  Substance and Sexual Activity  . Alcohol use: Yes    Comment: socially   . Drug use: Never  . Sexual activity: Not on file  Lifestyle  . Physical activity:    Days per week: Not on file    Minutes per session: Not on file  . Stress: Not on file  Relationships  . Social connections:    Talks on phone: Not on file    Gets together: Not on file    Attends religious service: Not on file    Active member of club or organization: Not on file    Attends meetings of clubs or organizations: Not on file    Relationship status: Not on file  Other Topics Concern  . Not on file  Social History Narrative  . Not on file   Allergies  Allergen Reactions  . Azithromycin Other (See Comments)    Possible elevated LFTs Elevated liver enzymes Possible elevated LFTs   . Cyclosporine Hives  . Meperidine Hives  . Penicillins Swelling    Neck swelling    Family History  Problem Relation Age of Onset  . Colon  polyps Father   . Colon polyps Sister   . Colon cancer Paternal Aunt   . Colon cancer Cousin   . Heart disease Neg Hx   . Rectal cancer Neg Hx   . Stomach cancer Neg Hx     Current Outpatient Medications (Endocrine & Metabolic):  .  predniSONE (DELTASONE) 50 MG tablet, Take 1 tablet (50 mg total) by mouth daily.   Current Outpatient Medications (Respiratory):  .  albuterol (PROAIR HFA) 108 (90 Base) MCG/ACT inhaler, Inhale into the lungs. .  cetirizine (ZYRTEC) 10 MG tablet, Take by mouth.  Current Outpatient Medications (Analgesics):  .  HYDROcodone-acetaminophen (NORCO) 10-325 MG tablet, Take 1 tablet by mouth every 6 (six) hours as needed. Marland Kitchen  HYDROcodone-acetaminophen (NORCO/VICODIN) 5-325 MG tablet, Take by mouth.  Current Outpatient Medications (Hematological):  Marland Kitchen  Cyanocobalamin (VITAMIN B 12 PO), Inject as directed. Every 10 days  Current Outpatient Medications (Other):  Marland Kitchen  CRANBERRY PO, Take by mouth. .  cyclobenzaprine (FLEXERIL) 5 MG tablet,  Take 1 tablet (5 mg total) by mouth 3 (three) times daily as needed for muscle spasms. .  famotidine (PEPCID) 20 MG tablet, Take 20 mg by mouth daily. Marland Kitchen  gabapentin (NEURONTIN) 100 MG capsule, Take 2 capsules (200 mg total) by mouth at bedtime. .  Magnesium Oxide 400 MG CAPS, Take by mouth. .  Omega-3 1000 MG CAPS, Take by mouth. .  phentermine 37.5 MG capsule, Take by mouth. Marland Kitchen  UNABLE TO FIND, daily. Med Name: Beet Root .  UNABLE TO FIND, daily. Med Name: Miralax OTC .  valACYclovir (VALTREX) 1000 MG tablet, Take by mouth. .  AMBULATORY NON FORMULARY MEDICATION, Massage once a week    Past medical history, social, surgical and family history all reviewed in electronic medical record.  No pertanent information unless stated regarding to the chief complaint.   Review of Systems:  No headache, visual changes, nausea, vomiting, diarrhea, constipation, dizziness, abdominal pain, skin rash, fevers, chills, night sweats, weight loss, swollen lymph nodes, body aches, joint swelling, chest pain, shortness of breath, mood changes.  Positive muscle aches  Objective  Blood pressure 114/60, pulse 98, height 6\' 1"  (1.854 m), weight 222 lb (100.7 kg), SpO2 95 %.    General: No apparent distress alert and oriented x3 mood and affect normal, dressed appropriately.  HEENT: Pupils equal, extraocular movements intact  Respiratory: Patient's speak in full sentences and does not appear short of breath  Cardiovascular: No lower extremity edema, non tender, no erythema  Skin: Warm dry intact with no signs of infection or rash on extremities or on axial skeleton.  Abdomen: Soft nontender  Neuro: Cranial nerves II through XII are intact, neurovascularly intact in all extremities with 2+ DTRs and 2+ pulses.  Lymph: No lymphadenopathy of posterior or anterior cervical chain or axillae bilaterally.  Gait normal with good balance and coordination.  MSK:  Non tender with full range of motion and good stability and  symmetric strength and tone of shoulders, elbows, wrist, hip, knee and ankles bilaterally.  Back Exam:  Inspection: Mild loss of lordosis Motion: Flexion 45 deg, Extension 15 deg, Side Bending to 45 deg bilaterally,  Rotation to 45 deg bilaterally  SLR laying: Mild radicular symptoms at 30 degrees of forward flexion in the L5 and S1 pattern XSLR laying: Negative  Palpable tenderness: None. FABER: Positive right. Sensory change: Gross sensation intact to all lumbar and sacral dermatomes.  Reflexes: 2+ at both patellar tendons, 2+ at achilles tendons, Babinski's downgoing.  Strength at foot  Plantar-flexion: 5/5 Dorsi-flexion: 5/5 Eversion: 5/5 Inversion: 5/5  Leg strength  Quad: 5/5 Hamstring: 5/5 Hip flexor: 5/5 Hip abductors: 5/5  Gait unremarkable. Osteopathic findings Cer C T3 extended rotated and side bent right inhaled third rib T9 extended rotated and side bent left L2 flexed rotated and side bent right Sacrum right on right  Procedure: Real-time Ultrasound Guided Injection of right piriformis tendon sheath Device: GE Logiq Q7 Ultrasound guided injection is preferred based studies that show increased duration, increased effect, greater accuracy, decreased procedural pain, increased response rate, and decreased cost with ultrasound guided versus blind injection.  Verbal informed consent obtained.  Time-out conducted.  Noted no overlying erythema, induration, or other signs of local infection.  Skin prepped in a sterile fashion.  Local anesthesia: Topical Ethyl chloride.  With sterile technique and under real time ultrasound guidance: With a 21-gauge 2 inch needle patient was injected with 0.5 cc of 0.5% Marcaine and 0.5 cc of Kenalog 40 mg/mL Completed without difficulty  Pain immediately resolved suggesting accurate placement of the medication.  Advised to call if fevers/chills, erythema, induration, drainage, or persistent bleeding.  Images permanently stored and available  for review in the ultrasound unit.  Impression: Technically successful ultrasound guided injection.   Impression and Recommendations:     This case required medical decision making of moderate complexity. The above documentation has been reviewed and is accurate and complete [name], DO       Note: This dictation was prepared with Dragon dictation along with smaller phrase technology. Any transcriptional errors that result from this process are unintentional.

## 2018-07-22 ENCOUNTER — Ambulatory Visit: Payer: Self-pay

## 2018-07-22 ENCOUNTER — Other Ambulatory Visit: Payer: Self-pay

## 2018-07-22 ENCOUNTER — Ambulatory Visit (INDEPENDENT_AMBULATORY_CARE_PROVIDER_SITE_OTHER): Payer: BLUE CROSS/BLUE SHIELD | Admitting: Family Medicine

## 2018-07-22 ENCOUNTER — Encounter: Payer: Self-pay | Admitting: Family Medicine

## 2018-07-22 VITALS — BP 114/60 | HR 98 | Ht 73.0 in | Wt 222.0 lb

## 2018-07-22 DIAGNOSIS — M999 Biomechanical lesion, unspecified: Secondary | ICD-10-CM

## 2018-07-22 DIAGNOSIS — M5416 Radiculopathy, lumbar region: Secondary | ICD-10-CM

## 2018-07-22 DIAGNOSIS — G5701 Lesion of sciatic nerve, right lower limb: Secondary | ICD-10-CM | POA: Diagnosis not present

## 2018-07-22 DIAGNOSIS — M25552 Pain in left hip: Secondary | ICD-10-CM

## 2018-07-22 MED ORDER — PREDNISONE 50 MG PO TABS: 50.0000 mg | ORAL_TABLET | Freq: Every day | ORAL | 0 refills | Status: DC

## 2018-07-22 MED ORDER — AMBULATORY NON FORMULARY MEDICATION: 4 refills | Status: DC

## 2018-07-22 NOTE — Assessment & Plan Note (Signed)
Repeat injection given today.  Tolerated procedure well.  Discussed icing regimen and home exercises.  Hopefully will have just as much benefit as the previous injections.  Patient has had no more lumbar radiculopathy which is in the differential and encouraged him to take the gabapentin on a more regular basis.  Follow-up with me again 6 to 8 weeks

## 2018-07-22 NOTE — Assessment & Plan Note (Signed)
Decision today to treat with OMT was based on Physical Exam  After verbal consent patient was treated with HVLA, ME, FPR techniques in  thoracic, lumbar and sacral areas  Patient tolerated the procedure well with improvement in symptoms  Patient given exercises, stretches and lifestyle modifications  See medications in patient instructions if given  Patient will follow up in  6-8 weeks 

## 2018-08-06 ENCOUNTER — Telehealth: Payer: Self-pay | Admitting: *Deleted

## 2018-08-06 NOTE — Telephone Encounter (Signed)
Covid-19 screening questions  Have you traveled in the last 14 days? no If yes where?  Do you now or have you had a fever in the last 14 days? no  Do you have any respiratory symptoms of shortness of breath or cough now or in the last 14 days? no  Do you have any family members or close contacts with diagnosed or suspected Covid-19 in the past 14 days? no  Have you been tested for Covid-19 and found to be positive? No  Pt is aware that care partner will be waiting in the car during procedure.  He will wear a mask into building

## 2018-08-09 ENCOUNTER — Other Ambulatory Visit: Payer: Self-pay

## 2018-08-09 ENCOUNTER — Encounter: Payer: Self-pay | Admitting: Internal Medicine

## 2018-08-09 ENCOUNTER — Ambulatory Visit (AMBULATORY_SURGERY_CENTER): Payer: BLUE CROSS/BLUE SHIELD | Admitting: Internal Medicine

## 2018-08-09 VITALS — BP 121/75 | HR 63 | Temp 97.6°F | Resp 17 | Ht 73.0 in | Wt 222.0 lb

## 2018-08-09 DIAGNOSIS — Z1211 Encounter for screening for malignant neoplasm of colon: Secondary | ICD-10-CM

## 2018-08-09 DIAGNOSIS — Z800 Family history of malignant neoplasm of digestive organs: Secondary | ICD-10-CM | POA: Diagnosis not present

## 2018-08-09 DIAGNOSIS — Z8371 Family history of colonic polyps: Secondary | ICD-10-CM

## 2018-08-09 DIAGNOSIS — K6282 Dysplasia of anus: Secondary | ICD-10-CM | POA: Diagnosis not present

## 2018-08-09 DIAGNOSIS — D129 Benign neoplasm of anus and anal canal: Secondary | ICD-10-CM

## 2018-08-09 MED ORDER — SODIUM CHLORIDE 0.9 % IV SOLN: 500.0000 mL | Freq: Once | INTRAVENOUS | Status: DC

## 2018-08-09 NOTE — Progress Notes (Signed)
Report to PACU, RN, vss, BBS= Clear.  

## 2018-08-09 NOTE — Progress Notes (Signed)
Called to room to assist during endoscopic procedure.  Patient ID and intended procedure confirmed with present staff. Received instructions for my participation in the procedure from the performing physician.  

## 2018-08-09 NOTE — Op Note (Addendum)
[facility] Patient Name: Kyle [name] Procedure Date: 08/09/2018 9:49 AM MRN: [id number] Endoscopist: Jerene Bears , MD Age: 48 Referring MD:  Date of Birth: 1970/12/14 Gender: Male Account #: [id number] Procedure:                Colonoscopy Indications:              Colon cancer screening in patient at increased                            risk: Family history of colon polyps in multiple                            1st-degree relatives, Family history of colorectal                            cancer in multiple 2nd degree relatives, Last                            colonoscopy 5 years ago Medicines:                Monitored Anesthesia Care Procedure:                Pre-Anesthesia Assessment:                           - Prior to the procedure, a History and Physical                            was performed, and patient medications and                            allergies were reviewed. The patient's tolerance of                            previous anesthesia was also reviewed. The risks                            and benefits of the procedure and the sedation                            options and risks were discussed with the patient.                            All questions were answered, and informed consent                            was obtained. Prior Anticoagulants: The patient has                            taken no previous anticoagulant or antiplatelet                            agents. ASA Grade Assessment: II - A patient with  mild systemic disease. After reviewing the risks                            and benefits, the patient was deemed in                            satisfactory condition to undergo the procedure.                           After obtaining informed consent, the colonoscope                            was passed under direct vision. Throughout the                            procedure, the patient's blood pressure, pulse, and                             oxygen saturations were monitored continuously. The                            Colonoscope was introduced through the anus and                            advanced to the terminal ileum. The colonoscopy was                            performed without difficulty. The patient tolerated                            the procedure well. The quality of the bowel                            preparation was good. The terminal ileum, ileocecal                            valve, appendiceal orifice, and rectum were                            photographed. The bowel preparation used was 2 day                            MiraLax and Suprep via split dose instruction. Scope In: 10:03:06 AM Scope Out: 10:20:59 AM Total Procedure Duration: 0 hours 17 minutes 53 seconds  Findings:                 The digital rectal exam was abnormal. There was                            subtle nodularity in the anal canal without mass.                            Normal sphincter tone.  Multiple small and large-mouthed diverticula were                            found in the sigmoid colon, descending colon,                            transverse colon and ascending colon.                           An area measuring approximately 5 mm with visible                            polyp was found in the anus. The polypoid area was                            sessile. Biopsies were taken with a cold forceps                            for histology.                           Internal hemorrhoids were found during                            retroflexion. The hemorrhoids were small.                           The exam was otherwise without abnormality. Complications:            No immediate complications. Estimated Blood Loss:     Estimated blood loss was minimal. Impression:               - Diverticulosis in the sigmoid colon, in the                            descending colon, in the  transverse colon and in                            the ascending colon.                           - Polypoid mucosa at the anal canal. Biopsied.                           - Small internal hemorrhoids.                           - The examination was otherwise normal. Recommendation:           - Patient has a contact number available for                            emergencies. The signs and symptoms of potential                            delayed complications were discussed with the  patient. Return to normal activities tomorrow.                            Written discharge instructions were provided to the                            patient.                           - Resume previous diet.                           - Continue present medications.                           - Await pathology results.                           - Repeat colonoscopy is recommended for screening                            purposes. The colonoscopy date will be determined                            after pathology results from today's exam become                            available for review. Jerene Bears, MD 08/09/2018 10:30:58 AM This report has been signed electronically.

## 2018-08-09 NOTE — Patient Instructions (Signed)
Thank you for allowing [place] to care for you today!  Await pathology results, Dr Hilarie Fredrickson will recommend next colonoscopy date at that time.  Resume previous diet and medications today.  Return to your normal activities tomorrow.     YOU HAD AN ENDOSCOPIC PROCEDURE TODAY AT [facility]:   Refer to the procedure report that was given to you for any specific questions about what was found during the examination.  If the procedure report does not answer your questions, please call your gastroenterologist to clarify.  If you requested that your care partner not be given the details of your procedure findings, then the procedure report has been included in a sealed envelope for you to review at your convenience later.  YOU SHOULD EXPECT: Some feelings of bloating in the abdomen. Passage of more gas than usual.  Walking can help get rid of the air that was put into your GI tract during the procedure and reduce the bloating. If you had a lower endoscopy (such as a colonoscopy or flexible sigmoidoscopy) you may notice spotting of blood in your stool or on the toilet paper. If you underwent a bowel prep for your procedure, you may not have a normal bowel movement for a few days.  Please Note:  You might notice some irritation and congestion in your nose or some drainage.  This is from the oxygen used during your procedure.  There is no need for concern and it should clear up in a day or so.  SYMPTOMS TO REPORT IMMEDIATELY:   Following lower endoscopy (colonoscopy or flexible sigmoidoscopy):  Excessive amounts of blood in the stool  Significant tenderness or worsening of abdominal pains  Swelling of the abdomen that is new, acute  Fever of 100F or higher  For urgent or emergent issues, a gastroenterologist can be reached at any hour by calling [phone].   DIET:  We do recommend a small meal at first, but then you may proceed to your regular diet.  Drink plenty of fluids but you  should avoid alcoholic beverages for 24 hours.  ACTIVITY:  You should plan to take it easy for the rest of today and you should NOT DRIVE or use heavy machinery until tomorrow (because of the sedation medicines used during the test).    FOLLOW UP: Our staff will call the number listed on your records 48-72 hours following your procedure to check on you and address any questions or concerns that you may have regarding the information given to you following your procedure. If we do not reach you, we will leave a message.  We will attempt to reach you two times.  During this call, we will ask if you have developed any symptoms of COVID 19. If you develop any symptoms (ie: fever, flu-like symptoms, shortness of breath, cough etc.) before then, please call [phone].  If you test positive for Covid 19 in the 2 weeks post procedure, please call and report this information to [place].    If any biopsies were taken you will be contacted by phone or by letter within the next 1-3 weeks.  Please call us at [phone] if you have not heard about the biopsies in 3 weeks.    SIGNATURES/CONFIDENTIALITY: You and/or your care partner have signed paperwork which will be entered into your electronic medical record.  These signatures attest to the fact that that the information above on your After Visit Summary has been reviewed and is understood.  Full  responsibility of the confidentiality of this discharge information lies with you and/or your care-partner.

## 2018-08-11 ENCOUNTER — Telehealth: Payer: Self-pay

## 2018-08-11 NOTE — Telephone Encounter (Signed)
  Follow up Call-  Call back number 08/09/2018  Post procedure Call Back phone  # [phone]  Permission to leave phone message Yes  Some recent data might be hidden     Patient questions:  Do you have a fever, pain , or abdominal swelling? No. Pain Score  0 *  Have you tolerated food without any problems? Yes.    Have you been able to return to your normal activities? Yes.    Do you have any questions about your discharge instructions: Diet   No. Medications  No. Follow up visit  No.  Do you have questions or concerns about your Car e? No.  Actions: * If pain score is 4 or above: No action needed, pain <4.   1. Have you developed a fever since your procedure? no  2.   Have you had an respiratory symptoms (SOB or cough) since your procedure? no  3.   Have you tested positive for COVID 19 since your procedure no  4.   Have you had any family members/close contacts diagnosed with the COVID 19 since your procedure?  no   If yes to any of these questions please route to Joylene John, RN and Alphonsa Gin, Therapist, sports.

## 2018-08-25 DIAGNOSIS — K6282 Dysplasia of anus: Secondary | ICD-10-CM | POA: Diagnosis not present

## 2018-09-22 DIAGNOSIS — D013 Carcinoma in situ of anus and anal canal: Secondary | ICD-10-CM | POA: Diagnosis not present

## 2018-10-04 ENCOUNTER — Other Ambulatory Visit: Payer: Self-pay | Admitting: *Deleted

## 2018-10-04 ENCOUNTER — Other Ambulatory Visit: Payer: Self-pay

## 2018-10-04 ENCOUNTER — Ambulatory Visit: Payer: BLUE CROSS/BLUE SHIELD | Admitting: *Deleted

## 2018-10-04 VITALS — Ht 73.0 in | Wt 225.0 lb

## 2018-10-04 DIAGNOSIS — R1013 Epigastric pain: Secondary | ICD-10-CM

## 2018-10-04 NOTE — Progress Notes (Signed)
No egg or soy allergy known to patient  No issues with past sedation with any surgeries  or procedures, no intubation problems  No diet pills per patient No home 02 use per patient  No blood thinners per patient  Pt denies issues with constipation  No A fib or A flutter  EMMI video sent to pt's e mail   In person PV today for egd 8-1- pt off phentermine

## 2018-10-08 ENCOUNTER — Telehealth: Payer: Self-pay | Admitting: Internal Medicine

## 2018-10-08 NOTE — Telephone Encounter (Signed)
Left message on vmail for patient regarding Covid-19 screening questions. °Covid-19 Screening Questions: °  °Do you now or have you had a fever in the last 14 days?  °  °Do you have any respiratory symptoms of shortness of breath or cough now or in the last 14 days?  °  °Do you have any family members or close contacts with diagnosed or suspected Covid-19 in the past 14 days?  °  °Have you been tested for Covid-19 and found to be positive?  °  ° °

## 2018-10-08 NOTE — Telephone Encounter (Signed)
    Covid-19 Screening Questions:   Do you now or have you had a fever in the last 14 days? NO    Do you have any respiratory symptoms of shortness of breath or cough now or in the last 14 days? NO    Do you have any family members or close contacts with diagnosed or suspected Covid-19 in the past 14 days? NO   Have you been tested for Covid-19 and found to be positive? NO

## 2018-10-09 ENCOUNTER — Encounter: Payer: Self-pay | Admitting: Internal Medicine

## 2018-10-09 ENCOUNTER — Other Ambulatory Visit: Payer: Self-pay

## 2018-10-09 ENCOUNTER — Ambulatory Visit (AMBULATORY_SURGERY_CENTER): Payer: BLUE CROSS/BLUE SHIELD | Admitting: Internal Medicine

## 2018-10-09 VITALS — BP 124/76 | HR 66 | Temp 98.4°F | Resp 18 | Ht 73.0 in | Wt 225.0 lb

## 2018-10-09 DIAGNOSIS — K2950 Unspecified chronic gastritis without bleeding: Secondary | ICD-10-CM | POA: Diagnosis not present

## 2018-10-09 DIAGNOSIS — B9681 Helicobacter pylori [H. pylori] as the cause of diseases classified elsewhere: Secondary | ICD-10-CM

## 2018-10-09 DIAGNOSIS — K2970 Gastritis, unspecified, without bleeding: Secondary | ICD-10-CM | POA: Diagnosis not present

## 2018-10-09 DIAGNOSIS — K210 Gastro-esophageal reflux disease with esophagitis: Secondary | ICD-10-CM

## 2018-10-09 DIAGNOSIS — R1013 Epigastric pain: Secondary | ICD-10-CM

## 2018-10-09 MED ORDER — SODIUM CHLORIDE 0.9 % IV SOLN: 500.0000 mL | Freq: Once | INTRAVENOUS | Status: DC

## 2018-10-09 MED ORDER — PANTOPRAZOLE SODIUM 40 MG PO TBEC: 40.0000 mg | DELAYED_RELEASE_TABLET | Freq: Every day | ORAL | 3 refills | Status: DC

## 2018-10-09 NOTE — Patient Instructions (Signed)
Await pathology results.  Change famotidine to pantoprazole 40 mg once a day.  YOU HAD AN ENDOSCOPIC PROCEDURE TODAY AT [facility]:   Refer to the procedure report that was given to you for any specific questions about what was found during the examination.  If the procedure report does not answer your questions, please call your gastroenterologist to clarify.  If you requested that your care partner not be given the details of your procedure findings, then the procedure report has been included in a sealed envelope for you to review at your convenience later.  YOU SHOULD EXPECT: Some feelings of bloating in the abdomen. Passage of more gas than usual.  Walking can help get rid of the air that was put into your GI tract during the procedure and reduce the bloating. If you had a lower endoscopy (such as a colonoscopy or flexible sigmoidoscopy) you may notice spotting of blood in your stool or on the toilet paper. If you underwent a bowel prep for your procedure, you may not have a normal bowel movement for a few days.  Please Note:  You might notice some irritation and congestion in your nose or some drainage.  This is from the oxygen used during your procedure.  There is no need for concern and it should clear up in a day or so.  SYMPTOMS TO REPORT IMMEDIATELY:   Following upper endoscopy (EGD)  Vomiting of blood or coffee ground material  New chest pain or pain under the shoulder blades  Painful or persistently difficult swallowing  New shortness of breath  Fever of 100F or higher  Black, tarry-looking stools  For urgent or emergent issues, a gastroenterologist can be reached at any hour by calling [phone].   DIET:  We do recommend a small meal at first, but then you may proceed to your regular diet.  Drink plenty of fluids but you should avoid alcoholic beverages for 24 hours.  ACTIVITY:  You should plan to take it easy for the rest of today and you should NOT  DRIVE or use heavy machinery until tomorrow (because of the sedation medicines used during the test).    FOLLOW UP: Our staff will call the number listed on your records 48-72 hours following your procedure to check on you and address any questions or concerns that you may have regarding the information given to you following your procedure. If we do not reach you, we will leave a message.  We will attempt to reach you two times.  During this call, we will ask if you have developed any symptoms of COVID 19. If you develop any symptoms (ie: fever, flu-like symptoms, shortness of breath, cough etc.) before then, please call [phone].  If you test positive for Covid 19 in the 2 weeks post procedure, please call and report this information to [place].    If any biopsies were taken you will be contacted by phone or by letter within the next 1-3 weeks.  Please call us at [phone] if you have not heard about the biopsies in 3 weeks.    SIGNATURES/CONFIDENTIALITY: You and/or your care partner have signed paperwork which will be entered into your electronic medical record.  These signatures attest to the fact that that the information above on your After Visit Summary has been reviewed and is understood.  Full responsibility of the confidentiality of this discharge information lies with you and/or your care-partner.

## 2018-10-09 NOTE — Op Note (Addendum)
[facility] Patient Name: Kyle [name] Procedure Date: 10/09/2018 9:18 AM MRN: [id number] Endoscopist: Jerene Bears , MD Age: 48 Referring MD:  Date of Birth: 11/22/1970 Gender: Male Account #: [id number] Procedure:                Upper GI endoscopy Indications:              Epigastric abdominal pain Medicines:                Monitored Anesthesia Care Procedure:                Pre-Anesthesia Assessment:                           - Prior to the procedure, a History and Physical                            was performed, and patient medications and                            allergies were reviewed. The patient's tolerance of                            previous anesthesia was also reviewed. The risks                            and benefits of the procedure and the sedation                            options and risks were discussed with the patient.                            All questions were answered, and informed consent                            was obtained. Prior Anticoagulants: The patient has                            taken no previous anticoagulant or antiplatelet                            agents. ASA Grade Assessment: II - A patient with                            mild systemic disease. After reviewing the risks                            and benefits, the patient was deemed in                            satisfactory condition to undergo the procedure.                           After obtaining informed consent, the endoscope was  passed under direct vision. Throughout the                            procedure, the patient's blood pressure, pulse, and                            oxygen saturations were monitored continuously. The                            Endoscope was introduced through the mouth, and                            advanced to the second part of duodenum. The upper                            GI endoscopy was accomplished  without difficulty.                            The patient tolerated the procedure well. Scope In: Scope Out: Findings:                 LA Grade A (one or more mucosal breaks less than 5                            mm, not extending between tops of 2 mucosal folds)                            esophagitis was found at the gastroesophageal                            junction. No evidence of Barrett's esophagus.                           The exam of the esophagus was otherwise normal.                           Diffuse mildly erythematous mucosa without bleeding                            was found in the gastric body and in the gastric                            antrum. Biopsies were taken with a cold forceps for                            histology and Helicobacter pylori testing (from                            gastric body, antrum and incisura).                           The cardia and gastric fundus were normal on  retroflexion.                           The examined duodenum was normal. Complications:            No immediate complications. Estimated Blood Loss:     Estimated blood loss was minimal. Impression:               - LA Grade A reflux esophagitis.                           - Erythematous mucosa in the gastric body and                            antrum. Biopsied.                           - Normal examined duodenum. Recommendation:           - Patient has a contact number available for                            emergencies. The signs and symptoms of potential                            delayed complications were discussed with the                            patient. Return to normal activities tomorrow.                            Written discharge instructions were provided to the                            patient.                           - Resume previous diet.                           - Continue present medications. Change famotidine                             to pantoprazole 40 mg once daily (at least 30                            minutes before 1st meal of the day). Famotidine can                            be used in the evening or at night as needed.                           - Await pathology results. Jerene Bears, MD 10/09/2018 9:33:40 AM This report has been signed electronically.

## 2018-10-09 NOTE — Progress Notes (Signed)
Called to room to assist during endoscopic procedure.  Patient ID and intended procedure confirmed with present staff. Received instructions for my participation in the procedure from the performing physician.  

## 2018-10-09 NOTE — Progress Notes (Signed)
To PACU, VSS. Report to [email or web address] 

## 2018-10-09 NOTE — Progress Notes (Signed)
Pt's states no medical or surgical changes since previsit or office visit.  [place]

## 2018-10-13 ENCOUNTER — Telehealth: Payer: Self-pay

## 2018-10-13 NOTE — Telephone Encounter (Signed)
  Follow up Call-  Call back number 10/09/2018 08/09/2018  Post procedure Call Back phone  # [phone]  Permission to leave phone message Yes Yes  Some recent data might be hidden     Patient questions:  Do you have a fever, pain , or abdominal swelling? No. Pain Score  0 *  Have you tolerated food without any problems? Yes.    Have you been able to return to your normal activities? Yes.    Do you have any questions about your discharge instructions: Diet   No. Medications  No. Follow up visit  No.  Do you have questions or concerns about your Care? No.  Actions: * If pain score is 4 or above: 1. No action needed, pain <4.Have you developed a fever since your procedure? no  2.   Have you had an respiratory symptoms (SOB or cough) since your procedure? no  3.   Have you tested positive for COVID 19 since your procedure no  4.   Have you had any family members/close contacts diagnosed with the COVID 19 since your procedure?  no   If yes to any of these questions please route to Joylene John, RN and Alphonsa Gin, Therapist, sports.

## 2018-10-14 ENCOUNTER — Other Ambulatory Visit: Payer: Self-pay

## 2018-10-14 MED ORDER — TETRACYCLINE HCL 500 MG PO CAPS: 500.0000 mg | ORAL_CAPSULE | Freq: Four times a day (QID) | ORAL | 0 refills | Status: DC

## 2018-10-14 MED ORDER — OMEPRAZOLE 20 MG PO CPDR: 20.0000 mg | DELAYED_RELEASE_CAPSULE | Freq: Two times a day (BID) | ORAL | 0 refills | Status: DC

## 2018-10-14 MED ORDER — METRONIDAZOLE 250 MG PO TABS: 250.0000 mg | ORAL_TABLET | Freq: Four times a day (QID) | ORAL | 0 refills | Status: DC

## 2018-10-14 MED ORDER — BISMUTH 262 MG PO CHEW: 524.0000 mg | CHEWABLE_TABLET | Freq: Four times a day (QID) | ORAL | 0 refills | Status: DC

## 2018-10-23 ENCOUNTER — Telehealth: Payer: Self-pay | Admitting: Gastroenterology

## 2018-10-23 MED ORDER — FLUCONAZOLE 100 MG PO TABS: 100.0000 mg | ORAL_TABLET | Freq: Every day | ORAL | 0 refills | Status: DC

## 2018-10-23 NOTE — Telephone Encounter (Signed)
Returning patient call.  Patient recently diagnosed with Hpylori and treated with Pylera.  Mouth has been getting sore and has a white coating.  Asking for diflucan or something to treat this.  Prescription sent for diflucan 200 mg day 1 then 100 mg for 7 more days.  Patient will update [place] on his condition next week.

## 2018-12-28 ENCOUNTER — Other Ambulatory Visit: Payer: Self-pay

## 2018-12-28 DIAGNOSIS — A048 Other specified bacterial intestinal infections: Secondary | ICD-10-CM

## 2018-12-30 ENCOUNTER — Other Ambulatory Visit: Payer: Self-pay | Admitting: Internal Medicine

## 2018-12-30 DIAGNOSIS — Z8619 Personal history of other infectious and parasitic diseases: Secondary | ICD-10-CM

## 2019-01-06 ENCOUNTER — Other Ambulatory Visit: Payer: BLUE CROSS/BLUE SHIELD

## 2019-01-06 DIAGNOSIS — Z8619 Personal history of other infectious and parasitic diseases: Secondary | ICD-10-CM

## 2019-01-07 LAB — HELICOBACTER PYLORI  SPECIAL ANTIGEN
MICRO NUMBER:: 1045064
SPECIMEN QUALITY: ADEQUATE

## 2019-01-14 ENCOUNTER — Ambulatory Visit (INDEPENDENT_AMBULATORY_CARE_PROVIDER_SITE_OTHER): Payer: BLUE CROSS/BLUE SHIELD | Admitting: Family Medicine

## 2019-01-14 ENCOUNTER — Other Ambulatory Visit: Payer: Self-pay

## 2019-01-14 ENCOUNTER — Encounter: Payer: Self-pay | Admitting: Family Medicine

## 2019-01-14 VITALS — BP 130/88 | HR 83 | Ht 73.0 in | Wt 238.0 lb

## 2019-01-14 DIAGNOSIS — M5416 Radiculopathy, lumbar region: Secondary | ICD-10-CM | POA: Diagnosis not present

## 2019-01-14 DIAGNOSIS — M999 Biomechanical lesion, unspecified: Secondary | ICD-10-CM | POA: Diagnosis not present

## 2019-01-14 NOTE — Assessment & Plan Note (Signed)
Patient is having no radicular symptoms at the moment.  Responded very well to osteopathic manipulation.  We discussed regimen home exercise, topical anti-inflammatories, discussed which activities to do which wants to avoid.  Patient will follow up with me again in 4 to 8 weeks

## 2019-01-14 NOTE — Assessment & Plan Note (Signed)
Decision today to treat with OMT was based on Physical Exam  After verbal consent patient was treated with HVLA, ME, FPR techniques in  thoracic, lumbar and sacral areas  Patient tolerated the procedure well with improvement in symptoms  Patient given exercises, stretches and lifestyle modifications  See medications in patient instructions if given  Patient will follow up in 4-8 weeks 

## 2019-01-14 NOTE — Patient Instructions (Addendum)
Tart cherry extract 1200mg  at night

## 2019-01-14 NOTE — Progress Notes (Signed)
[name] [address] [place], [place] [address] Phone: [phone] Subjective:   [name], am serving as a scribe for Dr. Hulan Saas.   CC: Left hip pain follow-up  [id number]  Kyle [name] is a 48 y.o. male coming in with complaint of left hip pain. Last seen on 07/22/2018. Patient states he has had some difficulty recently.  Patient has responded very well to osteopathic manipulation previously.  Has been quite sometime since we have seen patient though.  Increasing tightness recently.     Past Medical History:  Diagnosis Date  . Allergy   . Arthritis   . Asthma   . Cataract    small right eye   . Constipation   . Diverticulosis   . Family history of colonic polyps    dad and sister   . GERD (gastroesophageal reflux disease)   . Neuromuscular disorder ([place])    spine  . Neuropathy of foot    right and mild   Past Surgical History:  Procedure Laterality Date  . ANTERIOR CRUCIATE LIGAMENT REPAIR    . COLONOSCOPY    . EXTERNAL EAR SURGERY    . KNEE ARTHROSCOPY    . lasik     Social History   Socioeconomic History  . Marital status: Single    Spouse name: Not on file  . Number of children: Not on file  . Years of education: Not on file  . Highest education level: Not on file  Occupational History  . Not on file  Social Needs  . Financial resource strain: Not on file  . Food insecurity    Worry: Not on file    Inability: Not on file  . Transportation needs    Medical: Not on file    Non-medical: Not on file  Tobacco Use  . Smoking status: Former Research scientist (life sciences)  . Smokeless tobacco: Never Used  Substance and Sexual Activity  . Alcohol use: Yes    Comment: socially   . Drug use: Never  . Sexual activity: Not on file  Lifestyle  . Physical activity    Days per week: Not on file    Minutes per session: Not on file  . Stress: Not on file  Relationships  . Social Herbalist on phone: Not on file    Gets  together: Not on file    Attends religious service: Not on file    Active member of club or organization: Not on file    Attends meetings of clubs or organizations: Not on file    Relationship status: Not on file  Other Topics Concern  . Not on file  Social History Narrative  . Not on file   Allergies  Allergen Reactions  . Azithromycin Other (See Comments)    Possible elevated LFTs Elevated liver enzymes Possible elevated LFTs   . Cyclosporine Hives  . Meperidine Hives  . Penicillins Swelling    Neck swelling    Family History  Problem Relation Age of Onset  . Colon polyps Father   . Colon polyps Sister   . Colon cancer Paternal Aunt   . Colon cancer Cousin   . Heart disease Neg Hx   . Rectal cancer Neg Hx   . Stomach cancer Neg Hx     Current Outpatient Medications (Endocrine & Metabolic):  .  predniSONE (DELTASONE) 50 MG tablet, Take 1 tablet (50 mg total) by mouth daily. (Patient not taking: Reported  on 10/09/2018)   Current Outpatient Medications (Respiratory):  .  albuterol (PROAIR HFA) 108 (90 Base) MCG/ACT inhaler, Inhale into the lungs. .  cetirizine (ZYRTEC) 10 MG tablet, Take by mouth.  Current Outpatient Medications (Analgesics):  .  HYDROcodone-acetaminophen (NORCO) 10-325 MG tablet, Take 1 tablet by mouth every 6 (six) hours as needed. Marland Kitchen  HYDROcodone-acetaminophen (NORCO/VICODIN) 5-325 MG tablet, Take by mouth.  Current Outpatient Medications (Hematological):  Marland Kitchen  Cyanocobalamin (VITAMIN B 12 PO), Inject as directed. Every 10 days  Current Outpatient Medications (Other):  Marland Kitchen  AMBULATORY NON FORMULARY MEDICATION, Massage once a week (Patient not taking: Reported on 10/09/2018) .  Bismuth 262 MG CHEW, Chew 524 mg by mouth 4 (four) times daily. Marland Kitchen  CRANBERRY PO, Take by mouth. .  cyclobenzaprine (FLEXERIL) 5 MG tablet, Take 1 tablet (5 mg total) by mouth 3 (three) times daily as needed for muscle spasms. .  famotidine (PEPCID) 20 MG tablet, Take 20 mg by  mouth daily. .  fluconazole (DIFLUCAN) 100 MG tablet, Take 1 tablet (100 mg total) by mouth daily. Please take 2 pills (200 mg) for first dose then take 1 pill (100 mg) daily for 7 more days. Marland Kitchen  gabapentin (NEURONTIN) 100 MG capsule, Take 2 capsules (200 mg total) by mouth at bedtime. (Patient not taking: Reported on 10/09/2018) .  Magnesium Oxide 400 MG CAPS, Take by mouth. .  metroNIDAZOLE (FLAGYL) 250 MG tablet, Take 1 tablet (250 mg total) by mouth 4 (four) times daily. .  Omega-3 1000 MG CAPS, Take by mouth. Marland Kitchen  omeprazole (PRILOSEC) 20 MG capsule, Take 1 capsule (20 mg total) by mouth 2 (two) times daily before a meal. .  pantoprazole (PROTONIX) 40 MG tablet, Take 1 tablet (40 mg total) by mouth daily. Take at least 30 minutes before 1st meal of the day. .  phentermine 37.5 MG capsule, Take by mouth. .  tetracycline (SUMYCIN) 500 MG capsule, Take 1 capsule (500 mg total) by mouth 4 (four) times daily. Marland Kitchen  UNABLE TO FIND, daily. Med Name: Beet Root .  UNABLE TO FIND, daily. Med Name: Miralax OTC .  valACYclovir (VALTREX) 1000 MG tablet, Take by mouth.    Past medical history, social, surgical and family history all reviewed in electronic medical record.  No pertanent information unless stated regarding to the chief complaint.   Review of Systems:  No headache, visual changes, nausea, vomiting, diarrhea, constipation, dizziness, abdominal pain, skin rash, fevers, chills, night sweats, weight loss, swollen lymph nodes, body aches, joint swelling,  chest pain, shortness of breath, mood changes.  Positive muscle aches  Objective  Blood pressure 130/88, pulse 83, height 6\' 1"  (1.854 m), weight 238 lb (108 kg), SpO2 97 %.    General: No apparent distress alert and oriented x3 mood and affect normal, dressed appropriately.  HEENT: Pupils equal, extraocular movements intact  Respiratory: Patient's speak in full sentences and does not appear short of breath  Cardiovascular: No lower extremity  edema, non tender, no erythema  Skin: Warm dry intact with no signs of infection or rash on extremities or on axial skeleton.  Abdomen: Soft nontender  Neuro: Cranial nerves II through XII are intact, neurovascularly intact in all extremities with 2+ DTRs and 2+ pulses.  Lymph: No lymphadenopathy of posterior or anterior cervical chain or axillae bilaterally.  Gait normal with good balance and coordination.  MSK:  Non tender with full range of motion and good stability and symmetric strength and tone of shoulders, elbows, wrist, hip,  knee and ankles bilaterally.  Low back exam does have some loss of lordosis, tightness of the piriformis on the right side noted as well.  With positive Corky Sox.  Negative straight leg test no tenderness.  Does have tightness of the hamstrings are noted.  Deep tendon reflexes intact.  4+ out of 5 strength with near symmetric  Osteopathic findings   T7 extended rotated and side bent left L3 flexed rotated and side bent right Sacrum right on right    Impression and Recommendations:     This case required medical decision making of moderate complexity. The above documentation has been reviewed and is accurate and complete [name], DO       Note: This dictation was prepared with Dragon dictation along with smaller phrase technology. Any transcriptional errors that result from this process are unintentional.

## 2019-02-02 ENCOUNTER — Ambulatory Visit (INDEPENDENT_AMBULATORY_CARE_PROVIDER_SITE_OTHER): Payer: BLUE CROSS/BLUE SHIELD | Admitting: Family Medicine

## 2019-02-02 ENCOUNTER — Encounter: Payer: Self-pay | Admitting: Family Medicine

## 2019-02-02 ENCOUNTER — Other Ambulatory Visit: Payer: Self-pay

## 2019-02-02 VITALS — BP 122/70 | HR 76 | Ht 73.0 in | Wt 240.0 lb

## 2019-02-02 DIAGNOSIS — M25561 Pain in right knee: Secondary | ICD-10-CM | POA: Diagnosis not present

## 2019-02-02 DIAGNOSIS — M999 Biomechanical lesion, unspecified: Secondary | ICD-10-CM

## 2019-02-02 DIAGNOSIS — G8929 Other chronic pain: Secondary | ICD-10-CM | POA: Diagnosis not present

## 2019-02-02 DIAGNOSIS — G5701 Lesion of sciatic nerve, right lower limb: Secondary | ICD-10-CM | POA: Diagnosis not present

## 2019-02-02 NOTE — Progress Notes (Signed)
[name] [address] [place], [place] [address] Phone: [phone] Subjective:   I Kyle [name] am serving as a Education administrator for Dr. Hulan Saas.  I'm seeing this patient by the request  of:    CC:   [id number]   01/14/2019 OMT  02/02/2019 Kyle [name] is a 48 y.o. male coming in with complaint of back pain. Back is doing ok. Right knee injection today.  Has had knee pain previously.  Has responded well to injections previously.  Wants to know if he can have one.  Back exam mild discomfort overall.  Has had nerve pain.  Taking the gabapentin regularly.  Has responded well to manipulation     Past Medical History:  Diagnosis Date  . Allergy   . Arthritis   . Asthma   . Cataract    small right eye   . Constipation   . Diverticulosis   . Family history of colonic polyps    dad and sister   . GERD (gastroesophageal reflux disease)   . Neuromuscular disorder ([place])    spine  . Neuropathy of foot    right and mild   Past Surgical History:  Procedure Laterality Date  . ANTERIOR CRUCIATE LIGAMENT REPAIR    . COLONOSCOPY    . EXTERNAL EAR SURGERY    . KNEE ARTHROSCOPY    . lasik     Social History   Socioeconomic History  . Marital status: Single    Spouse name: Not on file  . Number of children: Not on file  . Years of education: Not on file  . Highest education level: Not on file  Occupational History  . Not on file  Social Needs  . Financial resource strain: Not on file  . Food insecurity    Worry: Not on file    Inability: Not on file  . Transportation needs    Medical: Not on file    Non-medical: Not on file  Tobacco Use  . Smoking status: Former Research scientist (life sciences)  . Smokeless tobacco: Never Used  Substance and Sexual Activity  . Alcohol use: Yes    Comment: socially   . Drug use: Never  . Sexual activity: Not on file  Lifestyle  . Physical activity    Days per week: Not on file    Minutes per session: Not on file  .  Stress: Not on file  Relationships  . Social Herbalist on phone: Not on file    Gets together: Not on file    Attends religious service: Not on file    Active member of club or organization: Not on file    Attends meetings of clubs or organizations: Not on file    Relationship status: Not on file  Other Topics Concern  . Not on file  Social History Narrative  . Not on file   Allergies  Allergen Reactions  . Azithromycin Other (See Comments)    Possible elevated LFTs Elevated liver enzymes Possible elevated LFTs   . Cyclosporine Hives  . Meperidine Hives  . Penicillins Swelling    Neck swelling    Family History  Problem Relation Age of Onset  . Colon polyps Father   . Colon polyps Sister   . Colon cancer Paternal Aunt   . Colon cancer Cousin   . Heart disease Neg Hx   . Rectal cancer Neg Hx   . Stomach cancer Neg Hx  Current Outpatient Medications (Endocrine & Metabolic):  .  predniSONE (DELTASONE) 50 MG tablet, Take 1 tablet (50 mg total) by mouth daily.   Current Outpatient Medications (Respiratory):  .  albuterol (PROAIR HFA) 108 (90 Base) MCG/ACT inhaler, Inhale into the lungs. .  cetirizine (ZYRTEC) 10 MG tablet, Take by mouth.  Current Outpatient Medications (Analgesics):  .  HYDROcodone-acetaminophen (NORCO) 10-325 MG tablet, Take 1 tablet by mouth every 6 (six) hours as needed. Marland Kitchen  HYDROcodone-acetaminophen (NORCO/VICODIN) 5-325 MG tablet, Take by mouth.  Current Outpatient Medications (Hematological):  Marland Kitchen  Cyanocobalamin (VITAMIN B 12 PO), Inject as directed. Every 10 days  Current Outpatient Medications (Other):  Marland Kitchen  AMBULATORY NON FORMULARY MEDICATION, Massage once a week .  Bismuth 262 MG CHEW, Chew 524 mg by mouth 4 (four) times daily. Marland Kitchen  CRANBERRY PO, Take by mouth. .  cyclobenzaprine (FLEXERIL) 5 MG tablet, Take 1 tablet (5 mg total) by mouth 3 (three) times daily as needed for muscle spasms. .  famotidine (PEPCID) 20 MG tablet, Take  20 mg by mouth daily. .  fluconazole (DIFLUCAN) 100 MG tablet, Take 1 tablet (100 mg total) by mouth daily. Please take 2 pills (200 mg) for first dose then take 1 pill (100 mg) daily for 7 more days. Marland Kitchen  gabapentin (NEURONTIN) 100 MG capsule, Take 2 capsules (200 mg total) by mouth at bedtime. .  Magnesium Oxide 400 MG CAPS, Take by mouth. .  metroNIDAZOLE (FLAGYL) 250 MG tablet, Take 1 tablet (250 mg total) by mouth 4 (four) times daily. .  Omega-3 1000 MG CAPS, Take by mouth. Marland Kitchen  omeprazole (PRILOSEC) 20 MG capsule, Take 1 capsule (20 mg total) by mouth 2 (two) times daily before a meal. .  pantoprazole (PROTONIX) 40 MG tablet, Take 1 tablet (40 mg total) by mouth daily. Take at least 30 minutes before 1st meal of the day. .  phentermine 37.5 MG capsule, Take by mouth. .  tetracycline (SUMYCIN) 500 MG capsule, Take 1 capsule (500 mg total) by mouth 4 (four) times daily. Marland Kitchen  UNABLE TO FIND, daily. Med Name: Beet Root .  UNABLE TO FIND, daily. Med Name: Miralax OTC .  valACYclovir (VALTREX) 1000 MG tablet, Take by mouth.    Past medical history, social, surgical and family history all reviewed in electronic medical record.  No pertanent information unless stated regarding to the chief complaint.   Review of Systems:  No headache, visual changes, nausea, vomiting, diarrhea, constipation, dizziness, abdominal pain, skin rash, fevers, chills, night sweats, weight loss, swollen lymph nodes, body aches, joint swelling, muscle aches, chest pain, shortness of breath, mood changes.   Objective  Blood pressure 122/70, pulse 76, height 6\' 1"  (1.854 m), weight 240 lb (108.9 kg), SpO2 98 %.    General: No apparent distress alert and oriented x3 mood and affect normal, dressed appropriately.  HEENT: Pupils equal, extraocular movements intact  Respiratory: Patient's speak in full sentences and does not appear short of breath  Cardiovascular: No lower extremity edema, non tender, no erythema  Skin: Warm  dry intact with no signs of infection or rash on extremities or on axial skeleton.  Abdomen: Soft nontender  Neuro: Cranial nerves II through XII are intact, neurovascularly intact in all extremities with 2+ DTRs and 2+ pulses.  Lymph: No lymphadenopathy of posterior or anterior cervical chain or axillae bilaterally.  Gait antalgic MSK:  tender with full range of motion and good stability and symmetric strength and tone of shoulders, elbows, wrist, hip,  nd ankles bilaterally.  Right knee exam shows the patient does have mild patella grind noted.  Patient does have tenderness over the medial and patellofemoral joint.  No significant no instability of the knee noted though.  Full range of motion.  Good stability.  Contralateral knee unremarkable.  Back exam loss of lordosis.  Positive Corky Sox.  Negative straight leg test.  Patient has 5 full strength of the lower extremities.  Osteopathic findings  T5 extended rotated and side bent left L2 flexed rotated and side bent right Sacrum right on right  After informed written and verbal consent, patient was seated on exam table. Right knee was prepped with alcohol swab and utilizing anterolateral approach, patient's right knee space was injected with 4:1  marcaine 0.5%: Kenalog 40mg /dL. Patient tolerated the procedure well without immediate complications.   Impression and Recommendations:     This case required medical decision making of moderate complexity. The above documentation has been reviewed and is accurate and complete [name], DO       Note: This dictation was prepared with Dragon dictation along with smaller phrase technology. Any transcriptional errors that result from this process are unintentional.

## 2019-02-03 ENCOUNTER — Encounter: Payer: Self-pay | Admitting: Family Medicine

## 2019-02-03 DIAGNOSIS — G8929 Other chronic pain: Secondary | ICD-10-CM | POA: Insufficient documentation

## 2019-02-03 NOTE — Assessment & Plan Note (Signed)
Knee pain.  Discussed which activities to do which wants to avoid.  Patient is to increase activity slowly.  Could be a candidate for viscosupplementation.  Likely some patellofemoral arthritis.  Patient will increase activity slowly.  Follow-up again in 4 to 8 weeks

## 2019-02-03 NOTE — Assessment & Plan Note (Signed)
Decision today to treat with OMT was based on Physical Exam  After verbal consent patient was treated with HVLA, ME, FPR techniques in  thoracic, lumbar and sacral areas  Patient tolerated the procedure well with improvement in symptoms  Patient given exercises, stretches and lifestyle modifications  See medications in patient instructions if given  Patient will follow up in 4-8 weeks 

## 2019-02-03 NOTE — Assessment & Plan Note (Signed)
More tightness noted today.  Discussed with patient about home exercises, icing regimen, which activities to do which wants to avoid.  Patient is to increase activity slowly.  Follow-up again in 4 to 8 weeks

## 2019-02-18 ENCOUNTER — Ambulatory Visit: Payer: BLUE CROSS/BLUE SHIELD | Admitting: Family Medicine

## 2019-03-01 ENCOUNTER — Other Ambulatory Visit: Payer: Self-pay | Admitting: Internal Medicine

## 2019-03-01 DIAGNOSIS — R110 Nausea: Secondary | ICD-10-CM

## 2019-03-01 DIAGNOSIS — R1013 Epigastric pain: Secondary | ICD-10-CM

## 2019-03-01 MED ORDER — ONDANSETRON HCL 4 MG PO TABS: 4.0000 mg | ORAL_TABLET | Freq: Three times a day (TID) | ORAL | 1 refills | Status: DC | PRN

## 2019-04-01 ENCOUNTER — Encounter: Payer: Self-pay | Admitting: Family Medicine

## 2019-04-01 ENCOUNTER — Ambulatory Visit (INDEPENDENT_AMBULATORY_CARE_PROVIDER_SITE_OTHER): Payer: BC Managed Care – PPO | Admitting: Family Medicine

## 2019-04-01 ENCOUNTER — Other Ambulatory Visit: Payer: Self-pay

## 2019-04-01 ENCOUNTER — Ambulatory Visit: Payer: BLUE CROSS/BLUE SHIELD | Admitting: Family Medicine

## 2019-04-01 VITALS — BP 140/90 | HR 72 | Ht 73.0 in | Wt 247.0 lb

## 2019-04-01 DIAGNOSIS — M5416 Radiculopathy, lumbar region: Secondary | ICD-10-CM | POA: Diagnosis not present

## 2019-04-01 DIAGNOSIS — M999 Biomechanical lesion, unspecified: Secondary | ICD-10-CM | POA: Diagnosis not present

## 2019-04-01 MED ORDER — HYDROCODONE-ACETAMINOPHEN 5-325 MG PO TABS: 1.0000 | ORAL_TABLET | Freq: Three times a day (TID) | ORAL | 0 refills | Status: DC | PRN

## 2019-04-01 MED ORDER — CIPROFLOXACIN HCL 500 MG PO TABS: 500.0000 mg | ORAL_TABLET | Freq: Two times a day (BID) | ORAL | 0 refills | Status: DC

## 2019-04-01 MED ORDER — CYCLOBENZAPRINE HCL 10 MG PO TABS: 10.0000 mg | ORAL_TABLET | Freq: Three times a day (TID) | ORAL | 0 refills | Status: DC | PRN

## 2019-04-01 MED ORDER — GABAPENTIN 100 MG PO CAPS: 200.0000 mg | ORAL_CAPSULE | Freq: Every day | ORAL | 3 refills | Status: DC

## 2019-04-01 NOTE — Assessment & Plan Note (Signed)
Low back pain, likely more of a lumbar radiculopathy again.  Patient believes it is more the piriformis.  We will consider injection in the near future.  Refilled multiple different medications including Vicodin for 5-day burst.  Patient knows not to use it on a regular basis and last time filled was over a year ago.  Gabapentin and Flexeril I think will be beneficial.  Patient would like to return again to consider the piriformis follow-up in 4 weeks

## 2019-04-01 NOTE — Patient Instructions (Addendum)
Good to see you Refill flexeril Refill gabapentin Pick up medications Cipro twice a day for 10 days  See me again in 4 weeks

## 2019-04-01 NOTE — Assessment & Plan Note (Signed)
Decision today to treat with OMT was based on Physical Exam  After verbal consent patient was treated with HVLA, ME, FPR techniques in  thoracic, lumbar and sacral areas  Patient tolerated the procedure well with improvement in symptoms  Patient given exercises, stretches and lifestyle modifications  See medications in patient instructions if given  Patient will follow up in 4-8 weeks 

## 2019-04-01 NOTE — Progress Notes (Signed)
[name] [address] [employer] [place] Phone: [phone] Subjective:   I Kyle [name] am serving as a Education administrator for Dr. Hulan Saas.  This visit occurred during the SARS-CoV-2 public health emergency.  Safety protocols were in place, including screening questions prior to the visit, additional usage of staff PPE, and extensive cleaning of exam room while observing appropriate contact time as indicated for disinfecting solutions.   I'm seeing this patient by the request  of:  Kyle Pries, MD  CC: Low back pain  [id number]   02/02/2019 More tightness noted today.  Discussed with patient about home exercises, icing regimen, which activities to do which wants to avoid.  Patient is to increase activity slowly.  Follow-up again in 4 to 8 weeks  04/01/2019 Kyle [name] is a 49 y.o. male coming in with complaint of back pain. Patient states he is not feeling too good. OMT. Piriformis is also still painful.  Patient recently started to increase activity as tolerated.  Patient though unfortunately feels that his foot is becoming more normal on regular basis.  Has not been taking any medications regularly, and has been noncompliant on the exercises.  Still taking care of his father as well.      Past Medical History:  Diagnosis Date  . Allergy   . Arthritis   . Asthma   . Cataract    small right eye   . Constipation   . Diverticulosis   . Family history of colonic polyps    dad and sister   . GERD (gastroesophageal reflux disease)   . Neuromuscular disorder ([place])    spine  . Neuropathy of foot    right and mild   Past Surgical History:  Procedure Laterality Date  . ANTERIOR CRUCIATE LIGAMENT REPAIR    . COLONOSCOPY    . EXTERNAL EAR SURGERY    . KNEE ARTHROSCOPY    . lasik     Social History   Socioeconomic History  . Marital status: Single    Spouse name: Not on file  . Number of children: Not on file  . Years of  education: Not on file  . Highest education level: Not on file  Occupational History  . Not on file  Tobacco Use  . Smoking status: Former Research scientist (life sciences)  . Smokeless tobacco: Never Used  Substance and Sexual Activity  . Alcohol use: Yes    Comment: socially   . Drug use: Never  . Sexual activity: Not on file  Other Topics Concern  . Not on file  Social History Narrative  . Not on file   Social Determinants of Health   Financial Resource Strain:   . Difficulty of Paying Living Expenses: Not on file  Food Insecurity:   . Worried About Charity fundraiser in the Last Year: Not on file  . Ran Out of Food in the Last Year: Not on file  Transportation Needs:   . Lack of Transportation (Medical): Not on file  . Lack of Transportation (Non-Medical): Not on file  Physical Activity:   . Days of Exercise per Week: Not on file  . Minutes of Exercise per Session: Not on file  Stress:   . Feeling of Stress : Not on file  Social Connections:   . Frequency of Communication with Friends and Family: Not on file  . Frequency of Social Gatherings with Friends and Family: Not on file  . Attends Religious Services: Not on  file  . Active Member of Clubs or Organizations: Not on file  . Attends Archivist Meetings: Not on file  . Marital Status: Not on file   Allergies  Allergen Reactions  . Azithromycin Other (See Comments)    Possible elevated LFTs Elevated liver enzymes Possible elevated LFTs   . Cyclosporine Hives  . Meperidine Hives  . Penicillins Swelling    Neck swelling    Family History  Problem Relation Age of Onset  . Colon polyps Father   . Colon polyps Sister   . Colon cancer Paternal Aunt   . Colon cancer Cousin   . Heart disease Neg Hx   . Rectal cancer Neg Hx   . Stomach cancer Neg Hx     Current Outpatient Medications (Endocrine & Metabolic):  .  predniSONE (DELTASONE) 50 MG tablet, Take 1 tablet (50 mg total) by mouth daily.   Current Outpatient  Medications (Respiratory):  .  albuterol (PROAIR HFA) 108 (90 Base) MCG/ACT inhaler, Inhale into the lungs. .  cetirizine (ZYRTEC) 10 MG tablet, Take by mouth.  Current Outpatient Medications (Analgesics):  .  HYDROcodone-acetaminophen (NORCO/VICODIN) 5-325 MG tablet, Take 1 tablet by mouth every 8 (eight) hours as needed for moderate pain.  Current Outpatient Medications (Hematological):  Marland Kitchen  Cyanocobalamin (VITAMIN B 12 PO), Inject as directed. Every 10 days  Current Outpatient Medications (Other):  Marland Kitchen  AMBULATORY NON FORMULARY MEDICATION, Massage once a week .  CRANBERRY PO, Take by mouth. .  cyclobenzaprine (FLEXERIL) 5 MG tablet, Take 1 tablet (5 mg total) by mouth 3 (three) times daily as needed for muscle spasms. .  famotidine (PEPCID) 20 MG tablet, Take 20 mg by mouth daily. .  Magnesium Oxide 400 MG CAPS, Take by mouth. .  Omega-3 1000 MG CAPS, Take by mouth. .  ondansetron (ZOFRAN) 4 MG tablet, Take 1 tablet (4 mg total) by mouth every 8 (eight) hours as needed for nausea or vomiting. .  pantoprazole (PROTONIX) 40 MG tablet, Take 1 tablet (40 mg total) by mouth daily. Take at least 30 minutes before 1st meal of the day. .  phentermine 37.5 MG capsule, Take by mouth. Marland Kitchen  UNABLE TO FIND, daily. Med Name: Beet Root .  UNABLE TO FIND, daily. Med Name: Miralax OTC .  valACYclovir (VALTREX) 1000 MG tablet, Take by mouth. .  ciprofloxacin (CIPRO) 500 MG tablet, Take 1 tablet (500 mg total) by mouth 2 (two) times daily. .  cyclobenzaprine (FLEXERIL) 10 MG tablet, Take 1 tablet (10 mg total) by mouth 3 (three) times daily as needed for muscle spasms. Marland Kitchen  gabapentin (NEURONTIN) 100 MG capsule, Take 2 capsules (200 mg total) by mouth at bedtime.    Past medical history, social, surgical and family history all reviewed in electronic medical record.  No pertanent information unless stated regarding to the chief complaint.   Review of Systems:  No headache, visual changes, nausea, vomiting,  diarrhea, constipation, dizziness, abdominal pain, skin rash, fevers, chills, night sweats, weight loss, swollen lymph nodes, body aches, joint swelling, chest pain, shortness of breath, mood changes. POSITIVE muscle aches  Objective  Blood pressure 140/90, pulse 72, height 6\' 1"  (1.854 m), weight 247 lb (112 kg), SpO2 98 %.   General: No apparent distress alert and oriented x3 mood and affect normal, dressed appropriately.  HEENT: Pupils equal, extraocular movements intact  Respiratory: Patient's speak in full sentences and does not appear short of breath  Cardiovascular: No lower extremity edema, non tender, no  erythema  Skin: Warm dry intact with no signs of infection or rash on extremities or on axial skeleton.  Abdomen: Soft nontender  Neuro: Cranial nerves II through XII are intact, neurovascularly intact in all extremities with 2+ DTRs and 2+ pulses.  Lymph: No lymphadenopathy of posterior or anterior cervical chain or axillae bilaterally.  Gait normal with good balance and coordination.  MSK:  tender with range of motion and good stability and symmetric strength and tone of shoulders, elbows, wrist, and ankles bilaterally.  Patient's right leg shows a mild positive straight leg test.  Patient does have some weakness noted with dorsi flexion of the right foot with 4 out of 5 strength.  Deep tendon reflexes though are intact.  Neurovascularly seems to be intact as well. Patient does have some mild loss of range of motion of the hip with internal and external range of motion.  Good strength though of the hip flexor.  Osteopathic findings T7 extended rotated and side bent left L3 flexed rotated and side bent right Sacrum right on right    Impression and Recommendations:     This case required medical decision making of moderate complexity. The above documentation has been reviewed and is accurate and complete [name], DO       Note: This dictation was prepared with Dragon  dictation along with smaller phrase technology. Any transcriptional errors that result from this process are unintentional.

## 2019-04-04 ENCOUNTER — Ambulatory Visit: Payer: BLUE CROSS/BLUE SHIELD | Admitting: Family Medicine

## 2019-04-21 ENCOUNTER — Ambulatory Visit (INDEPENDENT_AMBULATORY_CARE_PROVIDER_SITE_OTHER): Payer: BC Managed Care – PPO

## 2019-04-21 ENCOUNTER — Ambulatory Visit (INDEPENDENT_AMBULATORY_CARE_PROVIDER_SITE_OTHER): Payer: BC Managed Care – PPO | Admitting: Family Medicine

## 2019-04-21 ENCOUNTER — Encounter: Payer: Self-pay | Admitting: Family Medicine

## 2019-04-21 ENCOUNTER — Other Ambulatory Visit: Payer: Self-pay

## 2019-04-21 VITALS — BP 136/90 | HR 87 | Ht 73.0 in | Wt 250.0 lb

## 2019-04-21 DIAGNOSIS — M999 Biomechanical lesion, unspecified: Secondary | ICD-10-CM | POA: Diagnosis not present

## 2019-04-21 DIAGNOSIS — M7918 Myalgia, other site: Secondary | ICD-10-CM

## 2019-04-21 DIAGNOSIS — G5701 Lesion of sciatic nerve, right lower limb: Secondary | ICD-10-CM

## 2019-04-21 MED ORDER — COLCHICINE 0.6 MG PO TABS: 0.6000 mg | ORAL_TABLET | Freq: Every day | ORAL | 0 refills | Status: DC

## 2019-04-21 NOTE — Progress Notes (Signed)
Kyle [name] [address] [place] [name] Phone: [phone] Subjective:   I Kyle [name] am serving as a Education administrator for Dr. Hulan [name].  This visit occurred during the SARS-CoV-2 public health emergency.  Safety protocols were in place, including screening questions prior to the visit, additional usage of staff PPE, and extensive cleaning of exam room while observing appropriate contact time as indicated for disinfecting solutions.   I'm seeing this patient by the request  of:  Kyle Pries, MD  CC: Low back pain, piriformis follow-up  [id number]  Kyle [name] is a 49 y.o. male coming in with complaint of piriformis pain. OMT.Would like a piriformis injection today. Back is stiff due to the weather.  Patient has been dealing with piriformis for some time.  Has responded to injection in the past.  States that it is starting to have increasing calf pain again.  Notes when he knows he starts having more difficulty.  Starting to affect daily activities of daily living.     Past Medical History:  Diagnosis Date  . Allergy   . Arthritis   . Asthma   . Cataract    small right eye   . Constipation   . Diverticulosis   . Family history of colonic polyps    dad and sister   . GERD (gastroesophageal reflux disease)   . Neuromuscular disorder ([place])    spine  . Neuropathy of foot    right and mild   Past Surgical History:  Procedure Laterality Date  . ANTERIOR CRUCIATE LIGAMENT REPAIR    . COLONOSCOPY    . EXTERNAL EAR SURGERY    . KNEE ARTHROSCOPY    . lasik     Social History   Socioeconomic History  . Marital status: Single    Spouse name: Not on file  . Number of children: Not on file  . Years of education: Not on file  . Highest education level: Not on file  Occupational History  . Not on file  Tobacco Use  . Smoking status: Former Research scientist (life sciences)  . Smokeless tobacco: Never Used  Substance and Sexual Activity  . Alcohol  use: Yes    Comment: socially   . Drug use: Never  . Sexual activity: Not on file  Other Topics Concern  . Not on file  Social History Narrative  . Not on file   Social Determinants of Health   Financial Resource Strain:   . Difficulty of Paying Living Expenses: Not on file  Food Insecurity:   . Worried About Charity fundraiser in the Last Year: Not on file  . Ran Out of Food in the Last Year: Not on file  Transportation Needs:   . Lack of Transportation (Medical): Not on file  . Lack of Transportation (Non-Medical): Not on file  Physical Activity:   . Days of Exercise per Week: Not on file  . Minutes of Exercise per Session: Not on file  Stress:   . Feeling of Stress : Not on file  Social Connections:   . Frequency of Communication with Friends and Family: Not on file  . Frequency of Social Gatherings with Friends and Family: Not on file  . Attends Religious Services: Not on file  . Active Member of Clubs or Organizations: Not on file  . Attends Archivist Meetings: Not on file  . Marital Status: Not on file   Allergies  Allergen Reactions  . Azithromycin Other (  See Comments)    Possible elevated LFTs Elevated liver enzymes Possible elevated LFTs   . Cyclosporine Hives  . Meperidine Hives  . Penicillins Swelling    Neck swelling    Family History  Problem Relation Age of Onset  . Colon polyps Father   . Colon polyps Sister   . Colon cancer Paternal Aunt   . Colon cancer Cousin   . Heart disease Neg Hx   . Rectal cancer Neg Hx   . Stomach cancer Neg Hx     Current Outpatient Medications (Endocrine & Metabolic):  .  predniSONE (DELTASONE) 50 MG tablet, Take 1 tablet (50 mg total) by mouth daily.   Current Outpatient Medications (Respiratory):  .  albuterol (PROAIR HFA) 108 (90 Base) MCG/ACT inhaler, Inhale into the lungs. .  cetirizine (ZYRTEC) 10 MG tablet, Take by mouth.  Current Outpatient Medications (Analgesics):  .   HYDROcodone-acetaminophen (NORCO/VICODIN) 5-325 MG tablet, Take 1 tablet by mouth every 8 (eight) hours as needed for moderate pain. Marland Kitchen  colchicine 0.6 MG tablet, Take 1 tablet (0.6 mg total) by mouth daily.  Current Outpatient Medications (Hematological):  Marland Kitchen  Cyanocobalamin (VITAMIN B 12 PO), Inject as directed. Every 10 days  Current Outpatient Medications (Other):  Marland Kitchen  AMBULATORY NON FORMULARY MEDICATION, Massage once a week .  ciprofloxacin (CIPRO) 500 MG tablet, Take 1 tablet (500 mg total) by mouth 2 (two) times daily. Marland Kitchen  CRANBERRY PO, Take by mouth. .  cyclobenzaprine (FLEXERIL) 10 MG tablet, Take 1 tablet (10 mg total) by mouth 3 (three) times daily as needed for muscle spasms. .  cyclobenzaprine (FLEXERIL) 5 MG tablet, Take 1 tablet (5 mg total) by mouth 3 (three) times daily as needed for muscle spasms. .  famotidine (PEPCID) 20 MG tablet, Take 20 mg by mouth daily. Marland Kitchen  gabapentin (NEURONTIN) 100 MG capsule, Take 2 capsules (200 mg total) by mouth at bedtime. .  Magnesium Oxide 400 MG CAPS, Take by mouth. .  Omega-3 1000 MG CAPS, Take by mouth. .  ondansetron (ZOFRAN) 4 MG tablet, Take 1 tablet (4 mg total) by mouth every 8 (eight) hours as needed for nausea or vomiting. .  pantoprazole (PROTONIX) 40 MG tablet, Take 1 tablet (40 mg total) by mouth daily. Take at least 30 minutes before 1st meal of the day. .  phentermine 37.5 MG capsule, Take by mouth. Marland Kitchen  UNABLE TO FIND, daily. Med Name: Beet Root .  UNABLE TO FIND, daily. Med Name: Miralax OTC .  valACYclovir (VALTREX) 1000 MG tablet, Take by mouth.   Reviewed prior external information including notes and imaging from  primary care provider As well as notes that were available from care everywhere and other healthcare systems.  Past medical history, social, surgical and family history all reviewed in electronic medical record.  No pertanent information unless stated regarding to the chief complaint.   Review of Systems:  No  headache, visual changes, nausea, vomiting, diarrhea, constipation, dizziness, abdominal pain, skin rash, fevers, chills, night sweats, weight loss, swollen lymph nodes, body aches, joint swelling, chest pain, shortness of breath, mood changes. POSITIVE muscle aches  Objective  Blood pressure 136/90, pulse 87, height 6\' 1"  (1.854 m), weight 250 lb (113.4 kg), SpO2 96 %.   General: No apparent distress alert and oriented x3 mood and affect normal, dressed appropriately.  HEENT: Pupils equal, extraocular movements intact  Respiratory: Patient's speak in full sentences and does not appear short of breath  Cardiovascular: No lower extremity edema,  non tender, no erythema  Skin: Warm dry intact with no signs of infection or rash on extremities or on axial skeleton.  Abdomen: Soft nontender  Neuro: Cranial nerves II through XII are intact, neurovascularly intact in all extremities with 2+ DTRs and 2+ pulses.  Lymph: No lymphadenopathy of posterior or anterior cervical chain or axillae bilaterally.  Gait normal with good balance and coordination.  MSK:  Non tender with full range of motion and good stability and symmetric strength and tone of shoulders, elbows, wrist, hip, knee and ankles bilaterally.  Back Exam:  Inspection: Loss of lordosis Motion: Flexion 45 deg, Extension 25 deg, Side Bending to 35 deg bilaterally,  Rotation to 45 deg bilaterally  SLR laying: Mild positive right side XSLR laying: Negative  Palpable tenderness: None. FABER: Positive right. Sensory change: Gross sensation intact to all lumbar and sacral dermatomes.  Reflexes: 2+ at both patellar tendons, 2+ at achilles tendons, Babinski's downgoing.  Strength at foot  Plantar-flexion: 5/5 Dorsi-flexion: 5/5 Eversion: 5/5 Inversion: 5/5  Leg strength  Quad: 5/5 Hamstring: 5/5 Hip flexor: 5/5 Hip abductors: 5/5    Procedure: Real-time Ultrasound Guided Injection of right piriformis tendon sheath Device: GE Logiq  Q7 Ultrasound guided injection is preferred based studies that show increased duration, increased effect, greater accuracy, decreased procedural pain, increased response rate, and decreased cost with ultrasound guided versus blind injection.  Verbal informed consent obtained.  Time-out conducted.  Noted no overlying erythema, induration, or other signs of local infection.  Skin prepped in a sterile fashion.  Local anesthesia: Topical Ethyl chloride.  With sterile technique and under real time ultrasound guidance:   With a 22-gauge 3 inch needle injected with 0.5 cc of 0.5% Marcaine and 0.5 cc of Kenalog 40 mg/mL Completed without difficulty  Pain immediately resolved suggesting accurate placement of the medication.  Advised to call if fevers/chills, erythema, induration, drainage, or persistent bleeding.  Images permanently stored and available for review in the ultrasound unit.  Impression: Technically successful ultrasound guided injection.  Osteopathic findings   T9 extended rotated and side bent left L2 flexed rotated and side bent right Sacrum right on right    Impression and Recommendations:     This case required medical decision making of moderate complexity. The above documentation has been reviewed and is accurate and complete [name], DO       Note: This dictation was prepared with Dragon dictation along with smaller phrase technology. Any transcriptional errors that result from this process are unintentional.

## 2019-04-21 NOTE — Patient Instructions (Addendum)
Good to see you Colchicine daily for 5 days See me again in 6-8 weeks

## 2019-04-22 ENCOUNTER — Encounter: Payer: Self-pay | Admitting: Family Medicine

## 2019-04-22 NOTE — Assessment & Plan Note (Signed)
Chronic problem. Decision today to treat with OMT was based on Physical Exam  After verbal consent patient was treated with HVLA, ME, FPR techniques in  thoracic,  lumbar and sacral areas  Patient tolerated the procedure well with improvement in symptoms  Patient given exercises, stretches and lifestyle modifications  See medications in patient instructions if given  Patient will follow up in 4-8 weeks

## 2019-04-22 NOTE — Assessment & Plan Note (Signed)
Repeat injection given today, tolerated the procedure well, discussed icing regimen and home exercise, which activities to do which wants to avoid.  Patient is to increase activity slowly over the course the next several days.  Follow-up again in 4 to 8 weeks

## 2019-04-25 ENCOUNTER — Other Ambulatory Visit: Payer: Self-pay

## 2019-04-25 ENCOUNTER — Telehealth: Payer: Self-pay | Admitting: *Deleted

## 2019-04-25 ENCOUNTER — Ambulatory Visit (INDEPENDENT_AMBULATORY_CARE_PROVIDER_SITE_OTHER)
Admission: RE | Admit: 2019-04-25 | Discharge: 2019-04-25 | Disposition: A | Discharge status: Home / Self Care | Payer: BC Managed Care – PPO | Source: Ambulatory Visit | Attending: Internal Medicine | Admitting: Internal Medicine

## 2019-04-25 DIAGNOSIS — R1032 Left lower quadrant pain: Secondary | ICD-10-CM | POA: Diagnosis not present

## 2019-04-25 DIAGNOSIS — Z8719 Personal history of other diseases of the digestive system: Secondary | ICD-10-CM

## 2019-04-25 MED ORDER — IOHEXOL 300 MG/ML  SOLN
100.0000 mL | Freq: Once | INTRAMUSCULAR | Status: AC | PRN
  Administered 2019-04-25: 100 mL via INTRAVENOUS

## 2019-04-25 NOTE — Telephone Encounter (Signed)
Dr Hilarie Fredrickson has requested that patient have a CT abd/pelvis for LLQ abdominal pain. Patient with history of diverticulitis. Dr Hilarie Fredrickson would like to rule out diverticulitis and inguinal hernia as well.   Patient has been scheduled for CT abd/pelvis today at [facility] CT at 3:45 pm. He will be NPO 4 hours prior and drink 1 bottle contrast at 1:45 pm, 1 bottle contrast at 2:45 pm.  Patient has been advised of time/date/location and prep for CT and verbalizes understanding of this.

## 2019-05-12 ENCOUNTER — Ambulatory Visit: Payer: BC Managed Care – PPO | Admitting: Family Medicine

## 2019-06-13 ENCOUNTER — Ambulatory Visit (INDEPENDENT_AMBULATORY_CARE_PROVIDER_SITE_OTHER): Payer: BC Managed Care – PPO | Admitting: Family Medicine

## 2019-06-13 ENCOUNTER — Other Ambulatory Visit: Payer: Self-pay

## 2019-06-13 ENCOUNTER — Encounter: Payer: Self-pay | Admitting: Family Medicine

## 2019-06-13 VITALS — BP 110/70 | HR 66 | Ht 73.0 in | Wt 251.0 lb

## 2019-06-13 DIAGNOSIS — M999 Biomechanical lesion, unspecified: Secondary | ICD-10-CM | POA: Diagnosis not present

## 2019-06-13 DIAGNOSIS — M5416 Radiculopathy, lumbar region: Secondary | ICD-10-CM

## 2019-06-13 NOTE — Assessment & Plan Note (Signed)

## 2019-06-13 NOTE — Progress Notes (Signed)
[place] [address] [place] Phone: [phone] Subjective:   [name] am serving as a Education administrator for Dr. Hulan Saas.  This visit occurred during the SARS-CoV-2 public health emergency.  Safety protocols were in place, including screening questions prior to the visit, additional usage of staff PPE, and extensive cleaning of exam room while observing appropriate contact time as indicated for disinfecting solutions.   I'm seeing this patient by the request  of:  [name], MD  CC: Low back pain follow-up  [id number]  Kyle [name] is a 49 y.o. male coming in with complaint of piriformis pain. Here for OMT.  Patient has had some back pain recently.  Was having stomach issues.  Sent for CT abdomen.  This was independently visualized by me showing the patient does have degenerative disc disease at multiple levels still but not significant progression from previous imaging of the MRI.     Past Medical History:  Diagnosis Date  . Allergy   . Arthritis   . Asthma   . Cataract    small right eye   . Constipation   . Diverticulosis   . Family history of colonic polyps    dad and sister   . GERD (gastroesophageal reflux disease)   . Neuromuscular disorder ([place])    spine  . Neuropathy of foot    right and mild   Past Surgical History:  Procedure Laterality Date  . ANTERIOR CRUCIATE LIGAMENT REPAIR    . COLONOSCOPY    . EXTERNAL EAR SURGERY    . KNEE ARTHROSCOPY    . lasik     Social History   Socioeconomic History  . Marital status: Single    Spouse name: Not on file  . Number of children: Not on file  . Years of education: Not on file  . Highest education level: Not on file  Occupational History  . Not on file  Tobacco Use  . Smoking status: Former Research scientist (life sciences)  . Smokeless tobacco: Never Used  Substance and Sexual Activity  . Alcohol use: Yes    Comment: socially   . Drug use: Never  . Sexual  activity: Not on file  Other Topics Concern  . Not on file  Social History Narrative  . Not on file   Social Determinants of Health   Financial Resource Strain:   . Difficulty of Paying Living Expenses:   Food Insecurity:   . Worried About Charity fundraiser in the Last Year:   . Arboriculturist in the Last Year:   Transportation Needs:   . Film/video editor (Medical):   Marland Kitchen Lack of Transportation (Non-Medical):   Physical Activity:   . Days of Exercise per Week:   . Minutes of Exercise per Session:   Stress:   . Feeling of Stress :   Social Connections:   . Frequency of Communication with Friends and Family:   . Frequency of Social Gatherings with Friends and Family:   . Attends Religious Services:   . Active Member of Clubs or Organizations:   . Attends Archivist Meetings:   Marland Kitchen Marital Status:    Allergies  Allergen Reactions  . Azithromycin Other (See Comments)    Possible elevated LFTs Elevated liver enzymes Possible elevated LFTs   . Cyclosporine Hives  . Meperidine Hives  . Penicillins Swelling    Neck swelling    Family History  Problem Relation Age  of Onset  . Colon polyps Father   . Colon polyps Sister   . Colon cancer Paternal Aunt   . Colon cancer Cousin   . Heart disease Neg Hx   . Rectal cancer Neg Hx   . Stomach cancer Neg Hx     Current Outpatient Medications (Endocrine & Metabolic):  .  predniSONE (DELTASONE) 50 MG tablet, Take 1 tablet (50 mg total) by mouth daily.   Current Outpatient Medications (Respiratory):  .  albuterol (PROAIR HFA) 108 (90 Base) MCG/ACT inhaler, Inhale into the lungs. .  cetirizine (ZYRTEC) 10 MG tablet, Take by mouth.  Current Outpatient Medications (Analgesics):  .  colchicine 0.6 MG tablet, Take 1 tablet (0.6 mg total) by mouth daily. Marland Kitchen  HYDROcodone-acetaminophen (NORCO/VICODIN) 5-325 MG tablet, Take 1 tablet by mouth every 8 (eight) hours as needed for moderate pain.  Current Outpatient  Medications (Hematological):  Marland Kitchen  Cyanocobalamin (VITAMIN B 12 PO), Inject as directed. Every 10 days  Current Outpatient Medications (Other):  Marland Kitchen  AMBULATORY NON FORMULARY MEDICATION, Massage once a week .  ciprofloxacin (CIPRO) 500 MG tablet, Take 1 tablet (500 mg total) by mouth 2 (two) times daily. Marland Kitchen  CRANBERRY PO, Take by mouth. .  cyclobenzaprine (FLEXERIL) 10 MG tablet, Take 1 tablet (10 mg total) by mouth 3 (three) times daily as needed for muscle spasms. .  cyclobenzaprine (FLEXERIL) 5 MG tablet, Take 1 tablet (5 mg total) by mouth 3 (three) times daily as needed for muscle spasms. .  famotidine (PEPCID) 20 MG tablet, Take 20 mg by mouth daily. Marland Kitchen  gabapentin (NEURONTIN) 100 MG capsule, Take 2 capsules (200 mg total) by mouth at bedtime. .  Magnesium Oxide 400 MG CAPS, Take by mouth. .  Omega-3 1000 MG CAPS, Take by mouth. .  ondansetron (ZOFRAN) 4 MG tablet, Take 1 tablet (4 mg total) by mouth every 8 (eight) hours as needed for nausea or vomiting. .  pantoprazole (PROTONIX) 40 MG tablet, Take 1 tablet (40 mg total) by mouth daily. Take at least 30 minutes before 1st meal of the day. .  phentermine 37.5 MG capsule, Take by mouth. Marland Kitchen  UNABLE TO FIND, daily. Med Name: Beet Root .  UNABLE TO FIND, daily. Med Name: Miralax OTC .  valACYclovir (VALTREX) 1000 MG tablet, Take by mouth.   Reviewed prior external information including notes and imaging from  primary care provider As well as notes that were available from care everywhere and other healthcare systems.  Past medical history, social, surgical and family history all reviewed in electronic medical record.  No pertanent information unless stated regarding to the chief complaint.   Review of Systems:  No headache, visual changes, nausea, vomiting, diarrhea, constipation, dizziness, abdominal pain, skin rash, fevers, chills, night sweats, weight loss, swollen lymph nodes, body aches, joint swelling, chest pain, shortness of breath,  mood changes. POSITIVE muscle aches  Objective  Blood pressure 110/70, pulse 66, height 6\' 1"  (1.854 m), weight 251 lb (113.9 kg), SpO2 97 %.   General: No apparent distress alert and oriented x3 mood and affect normal, dressed appropriately.  HEENT: Pupils equal, extraocular movements intact  Respiratory: Patient's speak in full sentences and does not appear short of breath  Cardiovascular: No lower extremity edema, non tender, no erythema  Neuro: Cranial nerves II through XII are intact, neurovascularly intact in all extremities with 2+ DTRs and 2+ pulses.  Gait normal with good balance and coordination.  MSK:   Low back exam shows the  patient does have some mild loss of lordosis.  Tender to palpation of paraspinal musculature lumbar spine right greater than left.  Patient does have some tightness with straight leg test.  Osteopathic findings  T7 extended rotated and side bent left L2 flexed rotated and side bent right L4 flexed rotated and side bent right Sacrum right on right     Impression and Recommendations:     This case required medical decision making of moderate complexity. The above documentation has been reviewed and is accurate and complete [name], DO       Note: This dictation was prepared with Dragon dictation along with smaller phrase technology. Any transcriptional errors that result from this process are unintentional.

## 2019-06-13 NOTE — Patient Instructions (Addendum)
Good to see you See me again in 7-8 weeks 

## 2019-06-13 NOTE — Assessment & Plan Note (Addendum)
Chronic problem with exacerbation.  Known arthritic changes.  We discussed the possibility of an epidural again.  At this point we would like to hold both.  Patient would want to be referred to another individual if necessary.  We discussed though the core strengthening, responds well to manipulation.  Continue the gabapentin.  Flexeril for breakthrough pain.  Increase activity slowly.  Discussed vitamin supplementation.  Follow-up again for 7 to 8 weeks

## 2019-06-20 ENCOUNTER — Other Ambulatory Visit: Payer: Self-pay | Admitting: Internal Medicine

## 2019-06-20 DIAGNOSIS — R1032 Left lower quadrant pain: Secondary | ICD-10-CM

## 2019-06-20 DIAGNOSIS — K589 Irritable bowel syndrome without diarrhea: Secondary | ICD-10-CM

## 2019-06-20 MED ORDER — DICYCLOMINE HCL 10 MG PO CAPS: 20.0000 mg | ORAL_CAPSULE | Freq: Four times a day (QID) | ORAL | 1 refills | Status: DC | PRN

## 2019-06-20 NOTE — Progress Notes (Signed)
Georgina Snell spoke to me today at work and let me know that he has been having intermittent episodes of left lower quadrant abdominal pain.  Started in the middle the night last night, was very intense.  Felt the need to have bowel movement but could not but after trying several times he did have loose nonbloody nonmelenic stool.  Pain improved.  He is also been having pain in his left hip and left psoas muscle radiating down the left leg.  Pain this morning was associated with diaphoresis and nausea.  Pain very much sounds spastic in nature.  He has dealt intermittently with constipation and has been using MiraLAX.  I do think he should continue the MiraLAX and we will try an antispasmodic.  He recently had a colonoscopy and given the acute on and off nature of the pain it does not sound classic for diverticulitis  Bentyl 20 mg 4 times daily as needed He will let me know if pain worsening or not improving

## 2019-08-04 ENCOUNTER — Other Ambulatory Visit: Payer: Self-pay

## 2019-08-04 ENCOUNTER — Ambulatory Visit (INDEPENDENT_AMBULATORY_CARE_PROVIDER_SITE_OTHER): Payer: BC Managed Care – PPO | Admitting: Family Medicine

## 2019-08-04 ENCOUNTER — Ambulatory Visit: Payer: Self-pay

## 2019-08-04 ENCOUNTER — Encounter: Payer: Self-pay | Admitting: Family Medicine

## 2019-08-04 VITALS — BP 120/60 | HR 60 | Ht 73.0 in | Wt 237.0 lb

## 2019-08-04 DIAGNOSIS — M5416 Radiculopathy, lumbar region: Secondary | ICD-10-CM | POA: Diagnosis not present

## 2019-08-04 DIAGNOSIS — M7918 Myalgia, other site: Secondary | ICD-10-CM | POA: Diagnosis not present

## 2019-08-04 DIAGNOSIS — M999 Biomechanical lesion, unspecified: Secondary | ICD-10-CM

## 2019-08-04 DIAGNOSIS — G5701 Lesion of sciatic nerve, right lower limb: Secondary | ICD-10-CM

## 2019-08-04 NOTE — Assessment & Plan Note (Signed)
Injection given today, tolerated procedure well, discussed icing regimen and home exercises.  Chronic problem does respond well to the Flexeril.  He did recently have to have 1 Vicodin which she has not taken in quite some time.  Follow-up again in 6 to 8 weeks

## 2019-08-04 NOTE — Assessment & Plan Note (Signed)

## 2019-08-04 NOTE — Assessment & Plan Note (Signed)
Chronic problem with exacerbation.  Discussed Flexeril, gabapentin and to continue the medications.  Discussed icing regimen and home exercises.  Increase activity slowly.  Follow-up again 4 to 8 weeks

## 2019-08-04 NOTE — Patient Instructions (Addendum)
Good to see you You know the drill [email or web address] for compression daily for 2 weeks New exercises See me again in 7-8 weeks

## 2019-08-04 NOTE — Progress Notes (Signed)
[place] [address] [place] Phone: [phone] Subjective:   [name] am serving as a Education administrator for Dr. Hulan Saas.  This visit occurred during the SARS-CoV-2 public health emergency.  Safety protocols were in place, including screening questions prior to the visit, additional usage of staff PPE, and extensive cleaning of exam room while observing appropriate contact time as indicated for disinfecting solutions.   I'm seeing this patient by the request  of:  [name], MD  CC: Low back pain follow-up  [id number]  Kyle [name] is a 49 y.o. male coming in with complaint of hip and back pain. Last seen on 06/13/2019 for OMT. Patient states he would like a piriformis injection. States it is bothering him a lot.  Patient states that it is severe overall.  Patient states it is affecting daily activities.  Is taking Flexeril and has had to take a Vicodin for sleep been doing hot tub nightly.       Past Medical History:  Diagnosis Date  . Allergy   . Arthritis   . Asthma   . Cataract    small right eye   . Constipation   . Diverticulosis   . Family history of colonic polyps    dad and sister   . GERD (gastroesophageal reflux disease)   . Neuromuscular disorder ([place])    spine  . Neuropathy of foot    right and mild   Past Surgical History:  Procedure Laterality Date  . ANTERIOR CRUCIATE LIGAMENT REPAIR    . COLONOSCOPY    . EXTERNAL EAR SURGERY    . KNEE ARTHROSCOPY    . lasik     Social History   Socioeconomic History  . Marital status: Single    Spouse name: Not on file  . Number of children: Not on file  . Years of education: Not on file  . Highest education level: Not on file  Occupational History  . Not on file  Tobacco Use  . Smoking status: Former Research scientist (life sciences)  . Smokeless tobacco: Never Used  Substance and Sexual Activity  . Alcohol use: Yes    Comment: socially   . Drug use: Never  .  Sexual activity: Not on file  Other Topics Concern  . Not on file  Social History Narrative  . Not on file   Social Determinants of Health   Financial Resource Strain:   . Difficulty of Paying Living Expenses:   Food Insecurity:   . Worried About Charity fundraiser in the Last Year:   . Arboriculturist in the Last Year:   Transportation Needs:   . Film/video editor (Medical):   Marland Kitchen Lack of Transportation (Non-Medical):   Physical Activity:   . Days of Exercise per Week:   . Minutes of Exercise per Session:   Stress:   . Feeling of Stress :   Social Connections:   . Frequency of Communication with Friends and Family:   . Frequency of Social Gatherings with Friends and Family:   . Attends Religious Services:   . Active Member of Clubs or Organizations:   . Attends Archivist Meetings:   Marland Kitchen Marital Status:    Allergies  Allergen Reactions  . Azithromycin Other (See Comments)    Possible elevated LFTs Elevated liver enzymes Possible elevated LFTs   . Cyclosporine Hives  . Meperidine Hives  . Penicillins Swelling    Neck swelling  Family History  Problem Relation Age of Onset  . Colon polyps Father   . Colon polyps Sister   . Colon cancer Paternal Aunt   . Colon cancer Cousin   . Heart disease Neg Hx   . Rectal cancer Neg Hx   . Stomach cancer Neg Hx     Current Outpatient Medications (Endocrine & Metabolic):  .  predniSONE (DELTASONE) 50 MG tablet, Take 1 tablet (50 mg total) by mouth daily.   Current Outpatient Medications (Respiratory):  .  albuterol (PROAIR HFA) 108 (90 Base) MCG/ACT inhaler, Inhale into the lungs. .  cetirizine (ZYRTEC) 10 MG tablet, Take by mouth.  Current Outpatient Medications (Analgesics):  .  colchicine 0.6 MG tablet, Take 1 tablet (0.6 mg total) by mouth daily. Marland Kitchen  HYDROcodone-acetaminophen (NORCO/VICODIN) 5-325 MG tablet, Take 1 tablet by mouth every 8 (eight) hours as needed for moderate pain.  Current Outpatient  Medications (Hematological):  Marland Kitchen  Cyanocobalamin (VITAMIN B 12 PO), Inject as directed. Every 10 days  Current Outpatient Medications (Other):  Marland Kitchen  AMBULATORY NON FORMULARY MEDICATION, Massage once a week .  ciprofloxacin (CIPRO) 500 MG tablet, Take 1 tablet (500 mg total) by mouth 2 (two) times daily. Marland Kitchen  CRANBERRY PO, Take by mouth. .  cyclobenzaprine (FLEXERIL) 10 MG tablet, Take 1 tablet (10 mg total) by mouth 3 (three) times daily as needed for muscle spasms. .  cyclobenzaprine (FLEXERIL) 5 MG tablet, Take 1 tablet (5 mg total) by mouth 3 (three) times daily as needed for muscle spasms. Marland Kitchen  dicyclomine (BENTYL) 10 MG capsule, Take 2 capsules (20 mg total) by mouth 4 (four) times daily as needed for spasms. .  famotidine (PEPCID) 20 MG tablet, Take 20 mg by mouth daily. Marland Kitchen  gabapentin (NEURONTIN) 100 MG capsule, Take 2 capsules (200 mg total) by mouth at bedtime. .  Magnesium Oxide 400 MG CAPS, Take by mouth. .  Omega-3 1000 MG CAPS, Take by mouth. .  ondansetron (ZOFRAN) 4 MG tablet, Take 1 tablet (4 mg total) by mouth every 8 (eight) hours as needed for nausea or vomiting. .  pantoprazole (PROTONIX) 40 MG tablet, Take 1 tablet (40 mg total) by mouth daily. Take at least 30 minutes before 1st meal of the day. .  phentermine 37.5 MG capsule, Take by mouth. Marland Kitchen  UNABLE TO FIND, daily. Med Name: Beet Root .  UNABLE TO FIND, daily. Med Name: Miralax OTC .  valACYclovir (VALTREX) 1000 MG tablet, Take by mouth.   Reviewed prior external information including notes and imaging from  primary care provider As well as notes that were available from care everywhere and other healthcare systems.  Past medical history, social, surgical and family history all reviewed in electronic medical record.  No pertanent information unless stated regarding to the chief complaint.   Review of Systems:  No headache, visual changes, nausea, vomiting, diarrhea, constipation, dizziness, abdominal pain, skin rash,  fevers, chills, night sweats, weight loss, swollen lymph nodes, body aches, joint swelling, chest pain, shortness of breath, mood changes. POSITIVE muscle aches  Objective  There were no vitals taken for this visit.   General: No apparent distress alert and oriented x3 mood and affect normal, dressed appropriately.  HEENT: Pupils equal, extraocular movements intact  Respiratory: Patient's speak in full sentences and does not appear short of breath  Cardiovascular: No lower extremity edema, non tender, no erythema  Neuro: Cranial nerves II through XII are intact, neurovascularly intact in all extremities with 2+ DTRs and  2+ pulses.  Gait normal with good balance and coordination.  MSK:  tender with full range of motion and good stability and symmetric strength and tone of shoulders, elbows, wrist, hip, knee and ankles bilaterally.  Low back exam shows the patient does have mild loss of lordosis.  Severe tenderness in the right piriformis area.  Patient does have limited sidebending and rotation of moving back to lacking the last 5 to 10 degrees in all planes.  Tightness with straight leg test with mild radicular symptoms in the right leg.  Mild tightness also with severe FABER test.  Osteopathic findings  T9 extended rotated and side bent left L2 flexed rotated and side bent right Sacrum right on right  Procedure: Real-time Ultrasound Guided Injection of right piriformis tendon sheath Device: GE Logiq Q7 Ultrasound guided injection is preferred based studies that show increased duration, increased effect, greater accuracy, decreased procedural pain, increased response rate, and decreased cost with ultrasound guided versus blind injection.  Verbal informed consent obtained.  Time-out conducted.  Noted no overlying erythema, induration, or other signs of local infection.  Skin prepped in a sterile fashion.  Local anesthesia: Topical Ethyl chloride.  With sterile technique and under real time  ultrasound guidance: With a 22-gauge 3 inch needle patient was injected with 1 cc 0.5% Marcaine and 1 cc of Kenalog 40 mg/mL into the tendon sheath. Completed without difficulty  Pain immediately resolved suggesting accurate placement of the medication.  Advised to call if fevers/chills, erythema, induration, drainage, or persistent bleeding.  Images permanently stored and available for review in the ultrasound unit.  Impression: Technically successful ultrasound guided injection.   Impression and Recommendations:     This case required medical decision making of moderate complexity. The above documentation has been reviewed and is accurate and complete [name], DO       Note: This dictation was prepared with Dragon dictation along with smaller phrase technology. Any transcriptional errors that result from this process are unintentional.

## 2019-08-16 ENCOUNTER — Ambulatory Visit: Payer: BC Managed Care – PPO | Admitting: Family Medicine

## 2019-08-22 ENCOUNTER — Telehealth: Payer: Self-pay | Admitting: Family Medicine

## 2019-08-22 MED ORDER — PREDNISONE 50 MG PO TABS: 50.0000 mg | ORAL_TABLET | Freq: Every day | ORAL | 0 refills | Status: DC

## 2019-08-22 NOTE — Telephone Encounter (Signed)
Pt requesting an RX of steroid, said previously give a few 50mg  to help with spasms. Kyle [name]

## 2019-08-23 ENCOUNTER — Other Ambulatory Visit: Payer: Self-pay | Admitting: Family Medicine

## 2019-09-20 ENCOUNTER — Ambulatory Visit (INDEPENDENT_AMBULATORY_CARE_PROVIDER_SITE_OTHER): Payer: BLUE CROSS/BLUE SHIELD | Admitting: Family Medicine

## 2019-09-20 ENCOUNTER — Other Ambulatory Visit: Payer: Self-pay

## 2019-09-20 ENCOUNTER — Encounter: Payer: Self-pay | Admitting: Family Medicine

## 2019-09-20 VITALS — BP 110/80 | HR 57 | Ht 73.0 in | Wt 241.0 lb

## 2019-09-20 DIAGNOSIS — M5416 Radiculopathy, lumbar region: Secondary | ICD-10-CM | POA: Diagnosis not present

## 2019-09-20 DIAGNOSIS — M999 Biomechanical lesion, unspecified: Secondary | ICD-10-CM | POA: Diagnosis not present

## 2019-09-20 DIAGNOSIS — G8929 Other chronic pain: Secondary | ICD-10-CM

## 2019-09-20 DIAGNOSIS — M545 Low back pain: Secondary | ICD-10-CM

## 2019-09-20 MED ORDER — PREDNISONE 50 MG PO TABS: 50.0000 mg | ORAL_TABLET | Freq: Every day | ORAL | 0 refills | Status: DC

## 2019-09-20 MED ORDER — HYDROCODONE-ACETAMINOPHEN 5-325 MG PO TABS: 1.0000 | ORAL_TABLET | Freq: Three times a day (TID) | ORAL | 0 refills | Status: DC | PRN

## 2019-09-20 MED ORDER — GABAPENTIN 100 MG PO CAPS: 200.0000 mg | ORAL_CAPSULE | Freq: Every day | ORAL | 3 refills | Status: DC

## 2019-09-20 MED ORDER — CYCLOBENZAPRINE HCL 10 MG PO TABS: 10.0000 mg | ORAL_TABLET | Freq: Three times a day (TID) | ORAL | 0 refills | Status: DC | PRN

## 2019-09-20 NOTE — Patient Instructions (Addendum)
Epidural ordered  See me again 4-6 weeks after epidural Medications refilled

## 2019-09-20 NOTE — Assessment & Plan Note (Signed)
Lumbar radiculopathy with worsening symptoms.  Attempted osteopathic manipulation.  Patient with a chronic problem and worsening symptoms refilled all medications including the Vicodin for short-term.  Patient will have another epidural which she has not had one since 2019.  I am optimistic that patient should make improvement.  We will see patient in 4 to 6 weeks after the epidural.

## 2019-09-20 NOTE — Progress Notes (Signed)
[place] [address] [place] Phone: [phone] Subjective:   I Kyle [name] am serving as a Education administrator for Dr. Hulan Saas.  I'm seeing this patient by the request  of:  [name], MD  CC: Neck and back pain follow-up  [id number]  Kyle [name] is a 49 y.o. male coming in with complaint of back and neck pain. OMT 08/04/2019. Patient states he is not doing well. States he will need an epidural.  Patient states that the right foot is becoming numb again.  Has had this difficulty previously.  Did not get as much benefit from the piriformis injection the patient has had previously.  States that the pain is fairly severe overall.  Starting to affect daily activities.          Reviewed prior external information including notes and imaging from previsou exam, outside providers and external EMR if available.   As well as notes that were available from care everywhere and other healthcare systems.  Past medical history, social, surgical and family history all reviewed in electronic medical record.  No pertanent information unless stated regarding to the chief complaint.   Past Medical History:  Diagnosis Date  . Allergy   . Arthritis   . Asthma   . Cataract    small right eye   . Constipation   . Diverticulosis   . Family history of colonic polyps    dad and sister   . GERD (gastroesophageal reflux disease)   . Neuromuscular disorder ([place])    spine  . Neuropathy of foot    right and mild    Allergies  Allergen Reactions  . Azithromycin Other (See Comments)    Possible elevated LFTs Elevated liver enzymes Possible elevated LFTs   . Cyclosporine Hives  . Meperidine Hives  . Penicillins Swelling    Neck swelling      Review of Systems:  No headache, visual changes, nausea, vomiting, diarrhea, constipation, dizziness, abdominal pain, skin rash, fevers, chills, night sweats, weight loss, swollen lymph  nodes, body aches, joint swelling, chest pain, shortness of breath, mood changes. POSITIVE muscle aches  Objective  Blood pressure 110/80, pulse (!) 57, height 6\' 1"  (1.854 m), weight 241 lb (109.3 kg), SpO2 98 %.   General: No apparent distress alert and oriented x3 mood and affect normal, dressed appropriately.  HEENT: Pupils equal, extraocular movements intact  Respiratory: Patient's speak in full sentences and does not appear short of breath  Cardiovascular: No lower extremity edema, non tender, no erythema  Neuro: Cranial nerves II through XII are intact, neurovascularly intact in all extremities with 2+ DTRs and 2+ pulses.  Gait normal with good balance and coordination.   Back -low back exam shows the patient does have tightness noted of the paraspinal musculature right greater than left.  Positive straight leg test on the right side.  Deep tendon reflexes intact.  Poor core strength noted.  Mild limited range of motion with external rotation of the hips bilaterally.  Osteopathic findings  T5 extended rotated and side bent right L2 flexed rotated and side bent left Sacrum right on right       Assessment and Plan:  Lumbar radiculopathy Lumbar radiculopathy with worsening symptoms.  Attempted osteopathic manipulation.  Patient with a chronic problem and worsening symptoms refilled all medications including the Vicodin for short-term.  Patient will have another epidural which she has not had one since 2019.  I am optimistic  that patient should make improvement.  We will see patient in 4 to 6 weeks after the epidural.   Nonallopathic problems  Decision today to treat with OMT was based on Physical Exam  After verbal consent patient was treated with HVLA, ME, FPR techniques in thoracic, lumbar, and sacral  areas  Patient tolerated the procedure well with improvement in symptoms  Patient given exercises, stretches and lifestyle modifications  See medications in patient  instructions if given  Patient will follow up in 4-8 weeks      The above documentation has been reviewed and is accurate and complete [name], DO       Note: This dictation was prepared with Dragon dictation along with smaller phrase technology. Any transcriptional errors that result from this process are unintentional.

## 2019-09-29 ENCOUNTER — Other Ambulatory Visit: Payer: BLUE CROSS/BLUE SHIELD

## 2019-09-30 ENCOUNTER — Ambulatory Visit
Admission: RE | Admit: 2019-09-30 | Discharge: 2019-09-30 | Disposition: A | Discharge status: Home / Self Care | Payer: BLUE CROSS/BLUE SHIELD | Source: Ambulatory Visit | Attending: Family Medicine | Admitting: Family Medicine

## 2019-09-30 ENCOUNTER — Other Ambulatory Visit: Payer: Self-pay

## 2019-09-30 DIAGNOSIS — M545 Low back pain, unspecified: Secondary | ICD-10-CM

## 2019-09-30 DIAGNOSIS — G8929 Other chronic pain: Secondary | ICD-10-CM

## 2019-09-30 MED ORDER — METHYLPREDNISOLONE ACETATE 40 MG/ML INJ SUSP (RADIOLOG
120.0000 mg | Freq: Once | INTRAMUSCULAR | Status: AC
  Administered 2019-09-30: 120 mg via EPIDURAL

## 2019-09-30 MED ORDER — IOPAMIDOL (ISOVUE-M 200) INJECTION 41%
1.0000 mL | Freq: Once | INTRAMUSCULAR | Status: AC
  Administered 2019-09-30: 1 mL via EPIDURAL

## 2019-11-08 ENCOUNTER — Telehealth: Payer: Self-pay | Admitting: Internal Medicine

## 2019-11-08 DIAGNOSIS — R1013 Epigastric pain: Secondary | ICD-10-CM

## 2019-11-08 MED ORDER — PANTOPRAZOLE SODIUM 40 MG PO TBEC: 40.0000 mg | DELAYED_RELEASE_TABLET | Freq: Every day | ORAL | 3 refills | Status: DC

## 2019-11-08 MED ORDER — CIPROFLOXACIN HCL 500 MG PO TABS: 500.0000 mg | ORAL_TABLET | Freq: Two times a day (BID) | ORAL | 0 refills | Status: DC

## 2019-11-08 NOTE — Telephone Encounter (Signed)
Current reflux at night Waking up with sour brash, coughing, gagging Now sinus pain/pressure in setting of reflux.  Congestion.  He restarted pantoprazole 40 mg daily.  Too early to tell if helping No dysphagia No vomiting No fevers.  Plan Continue pantoprazole 40 mg once daily for GERD Cipro 500 mg BID x 7 days for sinusitis, to start if worsening pain, congestion, sinus pressure May need probiotic with antibiotics if he ends up taking the course  Let me know if not getting better

## 2019-11-18 NOTE — Progress Notes (Signed)
[place] Phone: [phone] Subjective:   [name], am serving as a scribe for Dr. Hulan Saas. This visit occurred during the SARS-CoV-2 public health emergency.  Safety protocols were in place, including screening questions prior to the visit, additional usage of staff PPE, and extensive cleaning of exam room while observing appropriate contact time as indicated for disinfecting solutions.   I'm seeing this patient by the request  of:  [name], MD  CC: Neck pain, low back pain follow-up  [id number]  Kyle [name] is a 49 y.o. male coming in with complaint of back and neck pain. OMT 09/20/2019. Patient states that he is having left sided neck pain. Took predisone for pain relief.  Patient tries to use it sparingly though.  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 Thoracic spine pain currently between scapula.   Medications patient has been prescribed: Gabapentin, Hydrocodone, Colchicine  Taking: Yes        Reviewed prior external information including notes and imaging from previsou exam, outside providers and external EMR if available.   As well as notes that were available from care everywhere and other healthcare systems.  Past medical history, social, surgical and family history all reviewed in electronic medical record.  No pertanent information unless stated regarding to the chief complaint.   Past Medical History:  Diagnosis Date  . Allergy   . Arthritis   . Asthma   . Cataract    small right eye   . Constipation   . Diverticulosis   . Family history of colonic polyps    dad and sister   . GERD (gastroesophageal reflux disease)   . Neuromuscular disorder ([place])    spine  . Neuropathy of foot    right and mild    Allergies  Allergen Reactions    . Azithromycin Other (See Comments)    Possible elevated LFTs Elevated liver enzymes Possible elevated LFTs   . Cyclosporine Hives  . Meperidine Hives  . Penicillins Swelling    Neck swelling      Review of Systems:  No headache, visual changes, nausea, vomiting, diarrhea, constipation, dizziness, abdominal pain, skin rash, fevers, chills, night sweats, weight loss, swollen lymph nodes, body aches, joint swelling, chest pain, shortness of breath, mood changes. POSITIVE muscle aches  Objective  Blood pressure 110/72, pulse 82, height 6\' 1"  (1.854 m), weight 238 lb (108 kg), SpO2 97 %.   General: No apparent distress alert and oriented x3 mood and affect normal, dressed appropriately.  HEENT: Pupils equal, extraocular movements intact  Respiratory: Patient's speak in full sentences and does not appear short of breath  Cardiovascular: No lower extremity edema, non tender, no erythema  Neuro: Cranial nerves II through XII are intact, neurovascularly intact in all extremities with 2+ DTRs and 2+ pulses.  Gait normal with good balance and coordination.  MSK:  Non tender with full range of motion and good stability and symmetric strength and tone of shoulders, elbows, wrist, hip, knee and ankles bilaterally.  Back - Normal skin, Spine with normal alignment and no deformity.  No tenderness to vertebral process palpation.  Paraspinous muscles are not tender and without spasm.   Range of motion is full at neck and lumbar sacral regions  Osteopathic findings   T3  extended rotated and side bent left inhaled rib T8 extended rotated and side bent right L2 flexed rotated and side bent right Sacrum right on right       Assessment and Plan:   Neck pain-chronic problem with also exacerbation, we did do some manipulation.  Try to stay away from high velocity.  He did decline velocity in the thoracic only.  Patient given refills of medications as necessary.  Patient is a certified nurse  anesthesia and we discussed medications and patient is able to understand the greater level.  Patient will follow up with me again in 6 to 8 weeks   Lumbar radiculopathy Chronic problems mild exacerbation last epidural 7/23 did seem to make a significant difference Discussed med including gabapentin, colchicine, may need to consider labs  RTC in 4-8 weeks      Nonallopathic problems  Decision today to treat with OMT was based on Physical Exam  After verbal consent patient was treated with HVLA, ME, FPR techniques in  rib, thoracic, lumbar, and sacral  areas avoided HVLA of the neck area.  Patient tolerated the procedure well with improvement in symptoms  Patient given exercises, stretches and lifestyle modifications  See medications in patient instructions if given  Patient will follow up in 4-8 weeks      The above documentation has been reviewed and is accurate and complete [name], DO       Note: This dictation was prepared with Dragon dictation along with smaller phrase technology. Any transcriptional errors that result from this process are unintentional.

## 2019-11-20 NOTE — Assessment & Plan Note (Signed)
Chronic problems mild exacerbation last epidural 7/23 Discussed med including gabapentin, colchicine, may need to consider labs  RTC in 4-8 weeks

## 2019-11-22 ENCOUNTER — Ambulatory Visit (INDEPENDENT_AMBULATORY_CARE_PROVIDER_SITE_OTHER): Payer: BLUE CROSS/BLUE SHIELD | Admitting: Family Medicine

## 2019-11-22 ENCOUNTER — Other Ambulatory Visit: Payer: Self-pay

## 2019-11-22 ENCOUNTER — Encounter: Payer: Self-pay | Admitting: Family Medicine

## 2019-11-22 VITALS — BP 110/72 | HR 82 | Ht 73.0 in | Wt 238.0 lb

## 2019-11-22 DIAGNOSIS — M5416 Radiculopathy, lumbar region: Secondary | ICD-10-CM

## 2019-11-22 DIAGNOSIS — M999 Biomechanical lesion, unspecified: Secondary | ICD-10-CM | POA: Diagnosis not present

## 2019-11-22 NOTE — Patient Instructions (Signed)
See me in 6-7 weeks 

## 2019-12-12 ENCOUNTER — Other Ambulatory Visit: Payer: Self-pay | Admitting: Internal Medicine

## 2019-12-12 MED ORDER — CIPROFLOXACIN HCL 500 MG PO TABS: 500.0000 mg | ORAL_TABLET | Freq: Two times a day (BID) | ORAL | 1 refills | Status: DC

## 2020-01-03 ENCOUNTER — Ambulatory Visit: Payer: BLUE CROSS/BLUE SHIELD | Admitting: Family Medicine

## 2020-01-12 ENCOUNTER — Encounter: Payer: Self-pay | Admitting: Family Medicine

## 2020-01-12 ENCOUNTER — Ambulatory Visit (INDEPENDENT_AMBULATORY_CARE_PROVIDER_SITE_OTHER): Payer: BLUE CROSS/BLUE SHIELD | Admitting: Family Medicine

## 2020-01-12 ENCOUNTER — Other Ambulatory Visit: Payer: Self-pay

## 2020-01-12 VITALS — BP 128/84 | HR 73 | Ht 73.0 in | Wt 247.0 lb

## 2020-01-12 DIAGNOSIS — M542 Cervicalgia: Secondary | ICD-10-CM | POA: Insufficient documentation

## 2020-01-12 DIAGNOSIS — M999 Biomechanical lesion, unspecified: Secondary | ICD-10-CM

## 2020-01-12 NOTE — Progress Notes (Signed)
Kyle [name] [place] Phone: [phone] Subjective:   [name], am serving as a scribe for Dr. Hulan Saas. This visit occurred during the SARS-CoV-2 public health emergency.  Safety protocols were in place, including screening questions prior to the visit, additional usage of staff PPE, and extensive cleaning of exam room while observing appropriate contact time as indicated for disinfecting solutions.   I'm seeing this patient by the request  of:  Kyle Pries, MD  CC: Low back and neck pain follow-up  [id number]  Kyle [name] is a 49 y.o. male coming in with complaint of back and neck pain. OMT 11/22/2019. Patient states that he has been doing well. PRN for the medications.  Patient does state that neck pain has improved after last manipulation we did do some muscle energy on the cervical spine.  Medications patient has been prescribed: Gabapentin, Flexeril   Taking: Yes         Reviewed prior external information including notes and imaging from previsou exam, outside providers and external EMR if available.   As well as notes that were available from care everywhere and other healthcare systems.  Past medical history, social, surgical and family history all reviewed in electronic medical record.  No pertanent information unless stated regarding to the chief complaint.   Past Medical History:  Diagnosis Date  . Allergy   . Arthritis   . Asthma   . Cataract    small right eye   . Constipation   . Diverticulosis   . Family history of colonic polyps    dad and sister   . GERD (gastroesophageal reflux disease)   . Neuromuscular disorder ([place])    spine  . Neuropathy of foot    right and mild    Allergies  Allergen Reactions  . Azithromycin Other (See Comments)    Possible elevated LFTs Elevated liver enzymes Possible elevated LFTs   . Cyclosporine Hives  . Meperidine Hives  .  Penicillins Swelling    Neck swelling      Review of Systems:  No headache, visual changes, nausea, vomiting, diarrhea, constipation, dizziness, abdominal pain, skin rash, fevers, chills, night sweats, weight loss, swollen lymph nodes, body aches, joint swelling, chest pain, shortness of breath, mood changes. POSITIVE muscle aches  Objective  Blood pressure 128/84, pulse 73, height 6\' 1"  (1.854 m), weight 247 lb (112 kg), SpO2 98 %.   General: No apparent distress alert and oriented x3 mood and affect normal, dressed appropriately.  HEENT: Pupils equal, extraocular movements intact  Respiratory: Patient's speak in full sentences and does not appear short of breath  Cardiovascular: No lower extremity edema, non tender, no erythema  Neuro: Cranial nerves II through XII are intact, neurovascularly intact in all extremities with 2+ DTRs and 2+ pulses.  Gait normal with good balance and coordination.  MSK:  Non tender with full range of motion and good stability and symmetric strength and tone of shoulders, elbows, wrist, hip, knee and ankles bilaterally.  Neck exam does have some mild tightness noted over the paraspinal musculature of the cervical spine at C2-C4 on the right.  Some tightness also in the upper thoracic on the right side near the parascapular region.  Low back some very mild loss of lordosis.  Some mild tightness of the FABER test bilaterally.  Negative straight leg test.  Mild increase in discomfort with extension.  Osteopathic findings  C2 flexed rotated and  side bent right C6 flexed rotated and side bent left T9 extended rotated and side bent left L2 flexed rotated and side bent right Sacrum right on right       Assessment and Plan:   Neck pain History of a bridging osteophyte at C6-C7.  Has been doing relatively well though with conservative therapy.  Did respond fairly well though to the muscle energy and again today felt like he was making improvement.   Discussed posture and ergonomics throughout the day and follow-up with me again 6 to 8 weeks.  Patient was adamant he enjoys the manipulation of the neck   Nonallopathic problems  Decision today to treat with OMT was based on Physical Exam  After verbal consent patient was treated with HVLA, ME, FPR techniques in cervical, thoracic, lumbar, and sacral  Areas we did avoid HVLA in the cervical neck.  Patient tolerated the procedure well with improvement in symptoms  Patient given exercises, stretches and lifestyle modifications  See medications in patient instructions if given  Patient will follow up in 4-8 weeks      The above documentation has been reviewed and is accurate and complete [name], DO       Note: This dictation was prepared with Dragon dictation along with smaller phrase technology. Any transcriptional errors that result from this process are unintentional.

## 2020-01-12 NOTE — Assessment & Plan Note (Signed)
History of a bridging osteophyte at C6-C7.  Has been doing relatively well though with conservative therapy.  Did respond fairly well though to the muscle energy and again today felt like he was making improvement.  Discussed posture and ergonomics throughout the day and follow-up with me again 6 to 8 weeks.  Patient was adamant he enjoys the manipulation of the neck

## 2020-01-12 NOTE — Patient Instructions (Signed)
Great to see ya Glad neck is feeling better See me in 7-8 weeks

## 2020-02-16 ENCOUNTER — Other Ambulatory Visit: Payer: Self-pay | Admitting: Internal Medicine

## 2020-02-16 DIAGNOSIS — B009 Herpesviral infection, unspecified: Secondary | ICD-10-CM

## 2020-02-16 MED ORDER — VALACYCLOVIR HCL 1 G PO TABS: 1000.0000 mg | ORAL_TABLET | Freq: Two times a day (BID) | ORAL | 1 refills | Status: AC

## 2020-03-05 NOTE — Progress Notes (Signed)
Tawana ScaleZach Marvyn [name] D.O. [facility] [address] [place] [id number] Phone: [phone] Subjective:   I Kyle [name] am serving as a Neurosurgeonscribe for Dr. Antoine PrimasZachary Deserea [name].  This visit occurred during the SARS-CoV-2 public health emergency.  Safety protocols were in place, including screening questions prior to the visit, additional usage of staff PPE, and extensive cleaning of exam room while observing appropriate contact time as indicated for disinfecting solutions.   I'm seeing this patient by the request  of:  Langley Gaussoborg, Robert Ted, MD  CC: Back pain follow-up  [id number]:Subjective  Kyle [name] is a 49 y.o. male coming in with complaint of back and neck pain. OMT 01/12/2020. Patient states he is in a lot of pain. Would like an injection today.  Patient has responded well to the piriformis injections previously.  Patient is having more low back pain again 2 with some radicular symptoms on the left side.  States that if he sits in a chair too high where his feet can touch the ground next day had severe amount of pain in the back.  Also noticed that he did have worsening discomfort and pain after ejaculation 1 time but only lasted 1 time.  Since then has been doing okay.  Did make him concerned though.  Patient states intermittently feels like he has some numbness somewhat in the legs and even in the pelvic area.  Denies any incontinence of the urine or bowels.  Medications patient has been prescribed: Gabapentin, Flexeril  Taking: Intermittently  Last piriformis injection given 04/2019  Epidural of back 09/30/2019          Reviewed prior external information including notes and imaging from previsou exam, outside providers and external EMR if available.   As well as notes that were available from care everywhere and other healthcare systems.  Past medical history, social, surgical and family history all reviewed in electronic medical record.  No pertanent information unless stated  regarding to the chief complaint.   Past Medical History:  Diagnosis Date   Allergy    Arthritis    Asthma    Cataract    small right eye    Constipation    Diverticulosis    Family history of colonic polyps    dad and sister    GERD (gastroesophageal reflux disease)    Neuromuscular disorder (HCC)    spine   Neuropathy of foot    right and mild    Allergies  Allergen Reactions   Azithromycin Other (See Comments)    Possible elevated LFTs Elevated liver enzymes Possible elevated LFTs    Cyclosporine Hives   Meperidine Hives   Penicillins Swelling    Neck swelling      Review of Systems:  No headache, visual changes, nausea, vomiting, diarrhea, constipation, dizziness, abdominal pain, skin rash, fevers, chills, night sweats, weight loss, swollen lymph nodes, body aches, joint swelling, chest pain, shortness of breath, mood changes. POSITIVE muscle aches  Objective  There were no vitals taken for this visit.   General: No apparent distress alert and oriented x3 mood and affect normal, dressed appropriately.  HEENT: Pupils equal, extraocular movements intact  Respiratory: Patient's speak in full sentences and does not appear short of breath  Cardiovascular: No lower extremity edema, non tender, no erythema  Patient does have some postsurgical changes of the left knee Patient back exam does have tightness with straight leg test bilaterally but does have some worsening FABER test on the right.  Mild  pain over the gluteal area on the left but very minimal compared to the right.  Patient does have tenderness to palpation over the left sacroiliac joint.  Neurovascularly intact distally.  Procedure: Real-time Ultrasound Guided Injection of right piriformis tendon sheath Device: GE Logiq Q7 Ultrasound guided injection is preferred based studies that show increased duration, increased effect, greater accuracy, decreased procedural pain, increased response rate, and  decreased cost with ultrasound guided versus blind injection.  Verbal informed consent obtained.  Time-out conducted.  Noted no overlying erythema, induration, or other signs of local infection.  Skin prepped in a sterile fashion.  Local anesthesia: Topical Ethyl chloride.  With sterile technique and under real time ultrasound guidance: With a 22-gauge-gauge 3 inch needle injected with 0.5 cc of 0.5% Marcaine and 1 cc of Kenalog 40 mg/mL within the tendon sheath Completed without difficulty  Pain immediately resolved suggesting accurate placement of the medication.  Advised to call if fevers/chills, erythema, induration, drainage, or persistent bleeding.  Impression: Technically successful ultrasound guided injection.  Osteopathic findings  T9 extended rotated and side bent left L2 flexed rotated and side bent right Sacrum right on right       Assessment and Plan:  Piriformis syndrome, right Patient was given injection today.  Tolerated the procedure well.  Still more concerned that this is more of a lumbar radiculopathy.  Patient does states that every time we get his injection it completely resolves patient's calf pain on the right side.  Patient will follow up with me again in 6 weeks.  Continues to respond fairly well to osteopathic manipulation.  Lumbar radiculopathy Patient does get signs and symptoms that does make me somewhat concerned that patient having worsening lumbar radiculopathy.  Patient has had some more discomfort with certain things that can cause pain such as sitting for too long time and more anterior than posterior pain of the leg.  Patient's MRI is from 2019 showed only mild stenosis and L3-L4 and L4-L5.  Patient has responded to epidurals previously and we discussed potentially considering this again.  Last epidural was in July.  Other consideration would be laboratory work-up to make sure nothing else is contributing.  Patient is in agreement with the plan.     Nonallopathic problems  Decision today to treat with OMT was based on Physical Exam  After verbal consent patient was treated with HVLA, ME, FPR techniques in  thoracic, lumbar, and sacral  areas  Patient tolerated the procedure well with improvement in symptoms  Patient given exercises, stretches and lifestyle modifications  See medications in patient instructions if given  Patient will follow up in 4-8 weeks      The above documentation has been reviewed and is accurate and complete Judi Saa, DO

## 2020-03-06 ENCOUNTER — Other Ambulatory Visit: Payer: Self-pay

## 2020-03-06 ENCOUNTER — Ambulatory Visit (INDEPENDENT_AMBULATORY_CARE_PROVIDER_SITE_OTHER): Payer: BLUE CROSS/BLUE SHIELD

## 2020-03-06 ENCOUNTER — Ambulatory Visit: Payer: Self-pay

## 2020-03-06 ENCOUNTER — Encounter: Payer: Self-pay | Admitting: Family Medicine

## 2020-03-06 ENCOUNTER — Ambulatory Visit (INDEPENDENT_AMBULATORY_CARE_PROVIDER_SITE_OTHER): Payer: BLUE CROSS/BLUE SHIELD | Admitting: Family Medicine

## 2020-03-06 VITALS — BP 150/94 | HR 78 | Ht 73.0 in | Wt 244.0 lb

## 2020-03-06 DIAGNOSIS — G5701 Lesion of sciatic nerve, right lower limb: Secondary | ICD-10-CM

## 2020-03-06 DIAGNOSIS — M999 Biomechanical lesion, unspecified: Secondary | ICD-10-CM | POA: Diagnosis not present

## 2020-03-06 DIAGNOSIS — M7918 Myalgia, other site: Secondary | ICD-10-CM | POA: Diagnosis not present

## 2020-03-06 DIAGNOSIS — M5416 Radiculopathy, lumbar region: Secondary | ICD-10-CM | POA: Diagnosis not present

## 2020-03-06 MED ORDER — PREDNISONE 50 MG PO TABS
50.0000 mg | ORAL_TABLET | Freq: Every day | ORAL | 0 refills | Status: DC
Start: 2020-03-06 — End: 2020-06-19

## 2020-03-06 MED ORDER — CYCLOBENZAPRINE HCL 5 MG PO TABS
5.0000 mg | ORAL_TABLET | Freq: Two times a day (BID) | ORAL | 3 refills | Status: DC | PRN
Start: 2020-03-06 — End: 2022-08-05

## 2020-03-06 NOTE — Assessment & Plan Note (Signed)
Patient was given injection today.  Tolerated the procedure well.  Still more concerned that this is more of a lumbar radiculopathy.  Patient does states that every time we get his injection it completely resolves patient's calf pain on the right side.  Patient will follow up with me again in 6 weeks.  Continues to respond fairly well to osteopathic manipulation.

## 2020-03-06 NOTE — Assessment & Plan Note (Signed)
Patient does get signs and symptoms that does make me somewhat concerned that patient having worsening lumbar radiculopathy.  Patient has had some more discomfort with certain things that can cause pain such as sitting for too long time and more anterior than posterior pain of the leg.  Patient's MRI is from 2019 showed only mild stenosis and L3-L4 and L4-L5.  Patient has responded to epidurals previously and we discussed potentially considering this again.  Last epidural was in July.  Other consideration would be laboratory work-up to make sure nothing else is contributing.  Patient is in agreement with the plan.

## 2020-03-06 NOTE — Patient Instructions (Signed)
Good to see you Back xray today  Refill medications 5 mg flexeril when needed  Injected piriformis today. Watch it if worsening MRI See me again in 6-8 weeks

## 2020-03-21 ENCOUNTER — Other Ambulatory Visit: Payer: Self-pay | Admitting: Internal Medicine

## 2020-03-21 DIAGNOSIS — J0100 Acute maxillary sinusitis, unspecified: Secondary | ICD-10-CM

## 2020-03-21 MED ORDER — LEVOFLOXACIN 500 MG PO TABS: 500.0000 mg | ORAL_TABLET | Freq: Every day | ORAL | 0 refills | Status: DC

## 2020-03-21 NOTE — Progress Notes (Signed)
Sinus infection symptoms, facial pressure, drainage COVID-19 test neg  Levofloxacin 500 mg daily x 7 days PCP if not better

## 2020-04-16 NOTE — Progress Notes (Unsigned)
[facility] [address] [place] Phone: [phone] Subjective:   [name] am serving as a Education administrator for Dr. Hulan Saas.  This visit occurred during the SARS-CoV-2 public health emergency.  Safety protocols were in place, including screening questions prior to the visit, additional usage of staff PPE, and extensive cleaning of exam room while observing appropriate contact time as indicated for disinfecting solutions.   I'm seeing this patient by the request  of:  [name], MD  CC: Back and neck pain follow-up  [id number]  Kyle [name] is a 50 y.o. male coming in with complaint of back and neck pain. OMT 03/06/2020. Piriformis injection given last visit. Patient states the injection helped and he is doing well.  Patient states some very mild back pain here and there.  Does respond well though to manipulation otherwise.  Feels like he is doing relatively well.  Medications patient has been prescribed: Flexeril  Taking: Very intermittently as well as with the gabapentin very intermittently         Reviewed prior external information including notes and imaging from previsou exam, outside providers and external EMR if available.   As well as notes that were available from care everywhere and other healthcare systems.  Past medical history, social, surgical and family history all reviewed in electronic medical record.  No pertanent information unless stated regarding to the chief complaint.   Past Medical History:  Diagnosis Date  . Allergy   . Arthritis   . Asthma   . Cataract    small right eye   . Constipation   . Diverticulosis   . Family history of colonic polyps    dad and sister   . GERD (gastroesophageal reflux disease)   . Neuromuscular disorder ([place])    spine  . Neuropathy of foot    right and mild    Allergies  Allergen Reactions  . Azithromycin Other (See Comments)    Possible elevated  LFTs Elevated liver enzymes Possible elevated LFTs   . Cyclosporine Hives  . Meperidine Hives  . Penicillins Swelling    Neck swelling      Review of Systems:  No headache, visual changes, nausea, vomiting, diarrhea, constipation, dizziness, abdominal pain, skin rash, fevers, chills, night sweats, weight loss, swollen lymph nodes, body aches, joint swelling, chest pain, shortness of breath, mood changes. POSITIVE muscle aches  Objective  Blood pressure 120/90, pulse 60, height 6\' 1"  (1.854 m), weight 246 lb (111.6 kg), SpO2 97 %.   General: No apparent distress alert and oriented x3 mood and affect normal, dressed appropriately.  HEENT: Pupils equal, extraocular movements intact  Respiratory: Patient's speak in full sentences and does not appear short of breath  Cardiovascular: No lower extremity edema, non tender, no erythema  Neuro: Cranial nerves II through XII are intact, neurovascularly intact in all extremities with 2+ DTRs and 2+ pulses.  Gait normal with good balance and coordination.  MSK:  Non tender with full range of motion and good stability and symmetric strength and tone of shoulders, elbows, wrist, hip, knee and ankles bilaterally.  Back -low back exam does have some mild loss of lordosis.  Some mild tenderness to palpation of the paraspinal musculature of the lumbar spine likely left.  Mild tightness with Corky Sox but very minimal.  Mild tightness in the right piriformis still noted as well. Osteopathic findings   T9 extended rotated and side bent left L2 flexed rotated  and side bent right Sacrum right on right       Assessment and Plan:  Piriformis syndrome, right Much improved after the injection.  Seems to be doing relatively well for 6 to 8 months.  Discussed with patient about the muscle relaxer and the gabapentin that he uses very intermittently.  Responding well to manipulation.  Follow-up again in 8-12 weeks  Neck pain History of surgery.  More muscle  energy.  Avoided any high velocity.  No change in management otherwise follow-up again in 3 months    Nonallopathic problems  Decision today to treat with OMT was based on Physical Exam  After verbal consent patient was treated with HVLA, ME, FPR techniques in  thoracic, lumbar, and sacral  areas  Patient tolerated the procedure well with improvement in symptoms  Patient given exercises, stretches and lifestyle modifications  See medications in patient instructions if given  Patient will follow up in 4-8 weeks      The above documentation has been reviewed and is accurate and complete [name], DO       Note: This dictation was prepared with Dragon dictation along with smaller phrase technology. Any transcriptional errors that result from this process are unintentional.

## 2020-04-17 ENCOUNTER — Encounter: Payer: Self-pay | Admitting: Family Medicine

## 2020-04-17 ENCOUNTER — Ambulatory Visit (INDEPENDENT_AMBULATORY_CARE_PROVIDER_SITE_OTHER): Payer: 59 | Admitting: Family Medicine

## 2020-04-17 ENCOUNTER — Other Ambulatory Visit: Payer: Self-pay

## 2020-04-17 ENCOUNTER — Ambulatory Visit (INDEPENDENT_AMBULATORY_CARE_PROVIDER_SITE_OTHER): Payer: BLUE CROSS/BLUE SHIELD | Admitting: Otolaryngology

## 2020-04-17 VITALS — BP 120/90 | HR 60 | Ht 73.0 in | Wt 246.0 lb

## 2020-04-17 DIAGNOSIS — M542 Cervicalgia: Secondary | ICD-10-CM | POA: Diagnosis not present

## 2020-04-17 DIAGNOSIS — G5701 Lesion of sciatic nerve, right lower limb: Secondary | ICD-10-CM

## 2020-04-17 DIAGNOSIS — M999 Biomechanical lesion, unspecified: Secondary | ICD-10-CM | POA: Diagnosis not present

## 2020-04-17 NOTE — Assessment & Plan Note (Signed)
History of surgery.  More muscle energy.  Avoided any high velocity.  No change in management otherwise follow-up again in 3 months

## 2020-04-17 NOTE — Patient Instructions (Signed)
Good to see you Doing great No changes See me again in 2-3 months

## 2020-04-17 NOTE — Assessment & Plan Note (Signed)
Much improved after the injection.  Seems to be doing relatively well for 6 to 8 months.  Discussed with patient about the muscle relaxer and the gabapentin that he uses very intermittently.  Responding well to manipulation.  Follow-up again in 8-12 weeks

## 2020-05-09 ENCOUNTER — Other Ambulatory Visit: Payer: Self-pay | Admitting: Internal Medicine

## 2020-05-09 DIAGNOSIS — R1032 Left lower quadrant pain: Secondary | ICD-10-CM

## 2020-05-09 MED ORDER — DICYCLOMINE HCL 10 MG PO CAPS: 20.0000 mg | ORAL_CAPSULE | Freq: Four times a day (QID) | ORAL | 1 refills | Status: DC | PRN

## 2020-06-14 NOTE — Progress Notes (Signed)
[facility] [address] [place] Phone: [phone] Subjective:   I Kyle [name] am serving as a Education administrator for Dr. Hulan Saas.  This visit occurred during the SARS-CoV-2 public health emergency.  Safety protocols were in place, including screening questions prior to the visit, additional usage of staff PPE, and extensive cleaning of exam room while observing appropriate contact time as indicated for disinfecting solutions.   I'm seeing this patient by the request  of:  [name], MD  CC: Low back pain  NID:[id number]  Kyle [name] is a 50 y.o. male coming in with complaint of back and neck pain. OMT 04/17/2020. Patient states he is doing ok today. States he is not horrible.  Has responded to manipulation previously.  Patient does have also been in trouble but nothing severe.  Wanting to know if he needs another injection at some point during the knee.  Medications patient has been prescribed: None          Reviewed prior external information including notes and imaging from previsou exam, outside providers and external EMR if available.   As well as notes that were available from care everywhere and other healthcare systems.  Past medical history, social, surgical and family history all reviewed in electronic medical record.  No pertanent information unless stated regarding to the chief complaint.   Past Medical History:  Diagnosis Date  . Allergy   . Arthritis   . Asthma   . Cataract    small right eye   . Constipation   . Diverticulosis   . Family history of colonic polyps    dad and sister   . GERD (gastroesophageal reflux disease)   . Neuromuscular disorder ([place])    spine  . Neuropathy of foot    right and mild    Allergies  Allergen Reactions  . Azithromycin Other (See Comments)    Possible elevated LFTs Elevated liver enzymes Possible elevated LFTs   . Cyclosporine Hives  . Meperidine Hives  .  Penicillins Swelling    Neck swelling      Review of Systems:  No headache, visual changes, nausea, vomiting, diarrhea, constipation, dizziness, abdominal pain, skin rash, fevers, chills, night sweats, weight loss, swollen lymph nodes, body aches, joint swelling, chest pain, shortness of breath, mood changes. POSITIVE muscle aches  Objective  Blood pressure 126/80, pulse 63, height 6\' 1"  (1.854 m), weight 251 lb (113.9 kg), SpO2 97 %.   General: No apparent distress alert and oriented x3 mood and affect normal, dressed appropriately.  HEENT: Pupils equal, extraocular movements intact  Respiratory: Patient's speak in full sentences and does not appear short of breath  Cardiovascular: No lower extremity edema, non tender, no erythema  Gait normal with good balance and coordination.  MSK: Right knee exam does have some mild lateral tracking of the patella.  Tenderness over the medial joint line. Back -low back exam does have some mild loss of lordosis.  Patient does have some mild tightness with FABER test bilaterally right greater than left.  Negative straight leg test today.  4+ out of 5 strength but symmetric in the lower extremities.  Osteopathic findings  C6 flexed rotated and side bent left T5 extended rotated and side bent right inhaled rib L2 flexed rotated and side bent right L5 flexed rotated and side bent left Sacrum right on right       Assessment and Plan:  Lumbar radiculopathy Stable overall.  No  significant radicular symptoms.  Patient though does still need the muscle relaxers intermittently.  Patient does have the Flexeril.  Given a refill of prednisone to take when needed.  Discussed icing regimen and home exercises.  Patient will follow up with me again 4 to 8 weeks.  Chronic pain of right knee Has had chronic knee pain for quite some time.  We discussed different treatment options.  Patient wants to hold but will consider injection at follow-up.  May need  possibly repeat x-rays.  Follow-up again in 6 weeks    Nonallopathic problems  Decision today to treat with OMT was based on Physical Exam  After verbal consent patient was treated with HVLA, ME, FPR techniques in cervical, rib, thoracic, lumbar, and sacral  areas  Patient tolerated the procedure well with improvement in symptoms  Patient given exercises, stretches and lifestyle modifications  See medications in patient instructions if given  Patient will follow up in 4-8 weeks      The above documentation has been reviewed and is accurate and complete [name], DO       Note: This dictation was prepared with Dragon dictation along with smaller phrase technology. Any transcriptional errors that result from this process are unintentional.

## 2020-06-19 ENCOUNTER — Encounter: Payer: Self-pay | Admitting: Family Medicine

## 2020-06-19 ENCOUNTER — Other Ambulatory Visit: Payer: Self-pay

## 2020-06-19 ENCOUNTER — Ambulatory Visit (INDEPENDENT_AMBULATORY_CARE_PROVIDER_SITE_OTHER): Payer: 59 | Admitting: Family Medicine

## 2020-06-19 VITALS — BP 126/80 | HR 63 | Ht 73.0 in | Wt 251.0 lb

## 2020-06-19 DIAGNOSIS — M9908 Segmental and somatic dysfunction of rib cage: Secondary | ICD-10-CM | POA: Diagnosis not present

## 2020-06-19 DIAGNOSIS — M9901 Segmental and somatic dysfunction of cervical region: Secondary | ICD-10-CM | POA: Diagnosis not present

## 2020-06-19 DIAGNOSIS — M9902 Segmental and somatic dysfunction of thoracic region: Secondary | ICD-10-CM | POA: Diagnosis not present

## 2020-06-19 DIAGNOSIS — M9903 Segmental and somatic dysfunction of lumbar region: Secondary | ICD-10-CM

## 2020-06-19 DIAGNOSIS — M25561 Pain in right knee: Secondary | ICD-10-CM

## 2020-06-19 DIAGNOSIS — M9904 Segmental and somatic dysfunction of sacral region: Secondary | ICD-10-CM | POA: Diagnosis not present

## 2020-06-19 DIAGNOSIS — M5416 Radiculopathy, lumbar region: Secondary | ICD-10-CM

## 2020-06-19 DIAGNOSIS — G8929 Other chronic pain: Secondary | ICD-10-CM

## 2020-06-19 MED ORDER — PREDNISONE 50 MG PO TABS: 50.0000 mg | ORAL_TABLET | Freq: Every day | ORAL | 0 refills | Status: DC

## 2020-06-19 NOTE — Assessment & Plan Note (Signed)
Stable overall.  No significant radicular symptoms.  Patient though does still need the muscle relaxers intermittently.  Patient does have the Flexeril.  Given a refill of prednisone to take when needed.  Discussed icing regimen and home exercises.  Patient will follow up with me again 4 to 8 weeks.

## 2020-06-19 NOTE — Patient Instructions (Addendum)
Good to see you Refill prednisone only take if you need it Consider wrist brace at night See me again in 6-8 weeks so you are ready for vacation

## 2020-06-19 NOTE — Assessment & Plan Note (Signed)
Has had chronic knee pain for quite some time.  We discussed different treatment options.  Patient wants to hold but will consider injection at follow-up.  May need possibly repeat x-rays.  Follow-up again in 6 weeks

## 2020-06-25 IMAGING — XA Imaging study
2 series · 2 of 2 positions shown · non-contrast
Comparison: none

CLINICAL DATA: Lumbosacral spondylosis without myelopathy. Chronic
low back pain radiating down both legs, worse on the left. Mild
spinal canal stenosis at L3-L4.

[Series 1: ortho adipose · 1 of 1 slices shown (1 of 2)]
[im 1/1]
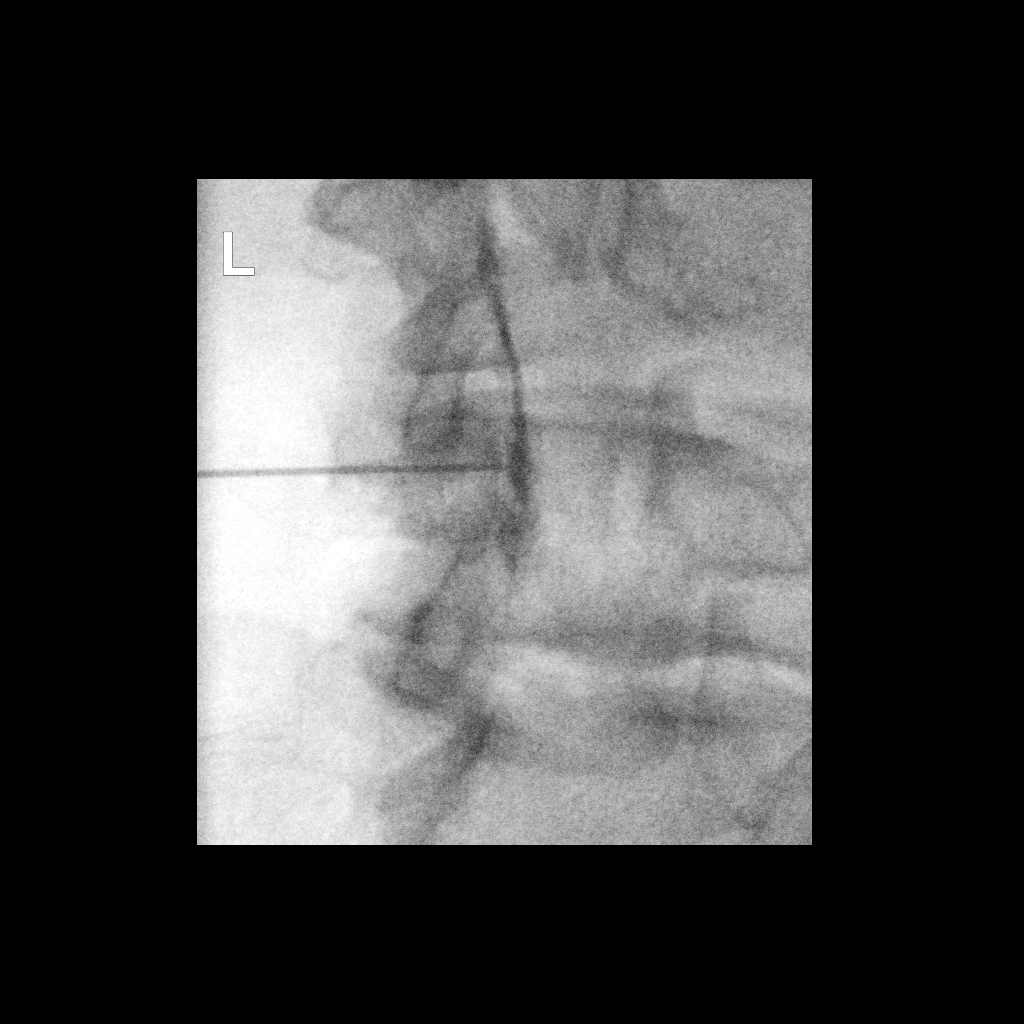

[Series 2: ortho adipose · 1 of 1 slices shown (2 of 2)]
[im 1/1]
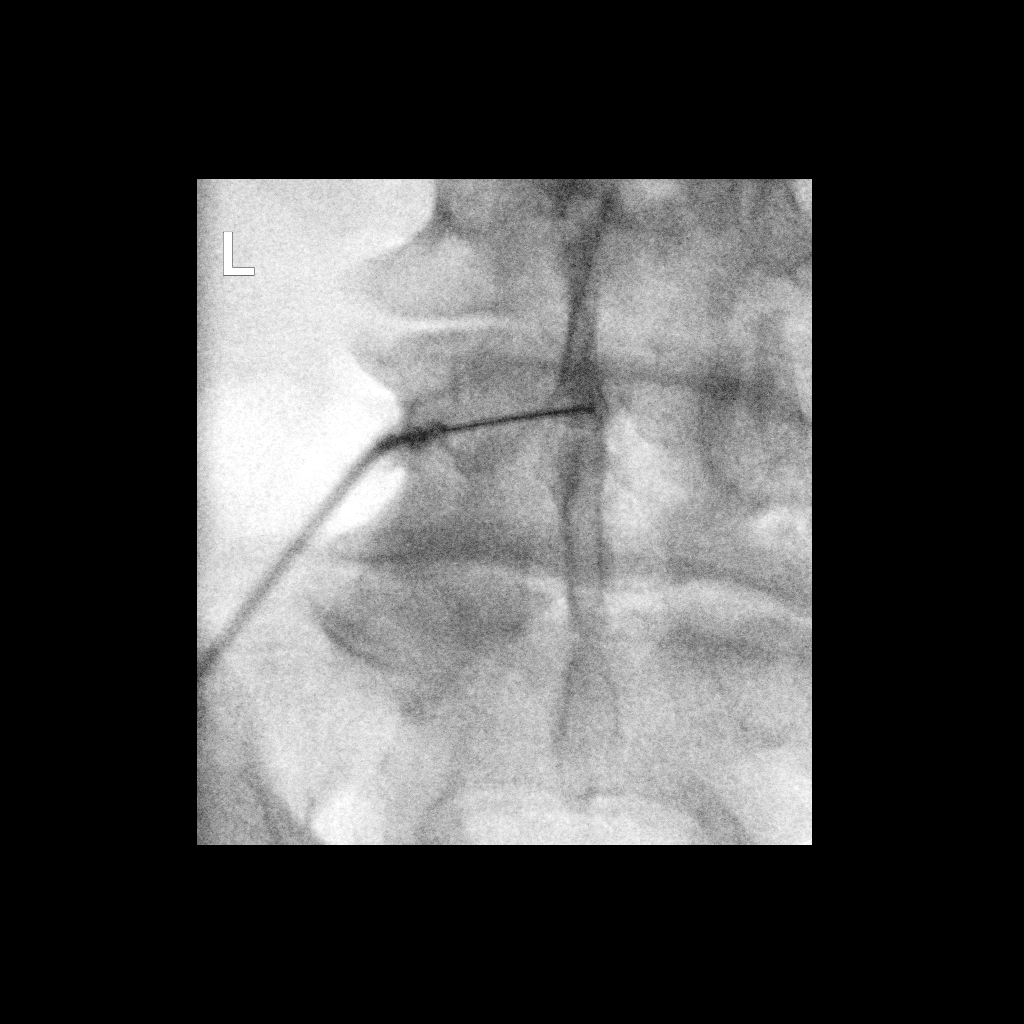

[2 of 2 positions shown; findings below may reference images not displayed]

FLUOROSCOPY TIME:  Radiation Exposure Index (as provided by the
fluoroscopic device): 3.6 mGy

Fluoroscopy Time:  2 seconds

Number of Acquired Images:  0

PROCEDURE:
The procedure, risks, benefits, and alternatives were explained to
the patient. Questions regarding the procedure were encouraged and
answered. The patient understands and consents to the procedure.

LUMBAR EPIDURAL INJECTION:

An interlaminar approach was performed on the left at L3-L4. The
overlying skin was cleansed and anesthetized. A 3.5 inch 20 gauge
epidural needle was advanced using loss-of-resistance technique.

DIAGNOSTIC EPIDURAL INJECTION:

Injection of Isovue-M 200 shows a good epidural pattern with spread
above and below the level of needle placement, primarily in the
midline. No vascular opacification is seen.

THERAPEUTIC EPIDURAL INJECTION:

120 mg of Depo-Medrol mixed with 3 mL of 1% lidocaine were
instilled. The procedure was well-tolerated, and the patient was
discharged thirty minutes following the injection in good condition.

COMPLICATIONS:
None immediate.
IMPRESSION: Technically successful epidural injection on the left at L3-L4 #1.

## 2020-08-14 ENCOUNTER — Ambulatory Visit: Payer: 59 | Admitting: Family Medicine

## 2020-08-20 ENCOUNTER — Other Ambulatory Visit: Payer: Self-pay | Admitting: Internal Medicine

## 2020-08-20 DIAGNOSIS — R1013 Epigastric pain: Secondary | ICD-10-CM

## 2020-08-20 DIAGNOSIS — K219 Gastro-esophageal reflux disease without esophagitis: Secondary | ICD-10-CM

## 2020-08-20 DIAGNOSIS — R1032 Left lower quadrant pain: Secondary | ICD-10-CM

## 2020-08-20 MED ORDER — CIPROFLOXACIN HCL 500 MG PO TABS
500.0000 mg | ORAL_TABLET | Freq: Two times a day (BID) | ORAL | 0 refills | Status: DC
Start: 2020-08-20 — End: 2021-10-21

## 2020-08-20 MED ORDER — DICYCLOMINE HCL 10 MG PO CAPS: 20.0000 mg | ORAL_CAPSULE | Freq: Four times a day (QID) | ORAL | 3 refills | Status: DC | PRN

## 2020-08-20 MED ORDER — ONDANSETRON HCL 4 MG PO TABS: 4.0000 mg | ORAL_TABLET | Freq: Three times a day (TID) | ORAL | 1 refills | Status: DC | PRN

## 2020-08-20 MED ORDER — PANTOPRAZOLE SODIUM 40 MG PO TBEC: 40.0000 mg | DELAYED_RELEASE_TABLET | Freq: Every day | ORAL | 3 refills | Status: DC

## 2020-08-24 NOTE — Progress Notes (Signed)
[facility] [address] [place] Phone: [phone] Subjective:   [name] am serving as a Education administrator for Dr. Hulan Saas.  This visit occurred during the SARS-CoV-2 public health emergency.  Safety protocols were in place, including screening questions prior to the visit, additional usage of staff PPE, and extensive cleaning of exam room while observing appropriate contact time as indicated for disinfecting solutions.   I'm seeing this patient by the request  of:  [name], MD  CC: back and neck pain   [name]  BILLIE INTRIAGO is a 50 y.o. male coming in with complaint of back and neck pain. OMT 06/19/2020. Patient states he is not doing well today. Back is painful today. Would like piriformis injection.  Patient has had injection previously in December of last year.  Patient has responded well to them in the past.  Has had epidurals in the back as well.  Is having increasing radicular symptoms in the legs.  Finding it more difficult to do daily activities.  Patient is out of his Vicodin and last filled in July.          Reviewed prior external information including notes and imaging from previsou exam, outside providers and external EMR if available.   As well as notes that were available from care everywhere and other healthcare systems.  Past medical history, social, surgical and family history all reviewed in electronic medical record.  No pertanent information unless stated regarding to the chief complaint.   Past Medical History:  Diagnosis Date   Allergy    Arthritis    Asthma    Cataract    small right eye    Constipation    Diverticulosis    Family history of colonic polyps    dad and sister    GERD (gastroesophageal reflux disease)    Neuromuscular disorder (HCC)    spine   Neuropathy of foot    right and mild    Allergies  Allergen Reactions   Azithromycin Other (See Comments)    Possible  elevated LFTs Elevated liver enzymes Possible elevated LFTs    Cyclosporine Hives   Meperidine Hives   Penicillins Swelling    Neck swelling      Review of Systems:  No headache, visual changes, nausea, vomiting, diarrhea, constipation, dizziness, abdominal pain, skin rash, fevers, chills, night sweats, weight loss, swollen lymph nodes, body aches, joint swelling, chest pain, shortness of breath, mood changes. POSITIVE muscle aches  Objective  Blood pressure 124/90, pulse 66, height 6\' 1"  (1.854 m), weight 249 lb (112.9 kg), SpO2 97 %.   General: No apparent distress alert and oriented x3 mood and affect normal, dressed appropriately.  HEENT: Pupils equal, extraocular movements intact  Respiratory: Patient's speak in full sentences and does not appear short of breath  Cardiovascular: No lower extremity edema, non tender, no erythema  Gait normal with good balance and coordination.  MSK:  Non tender with full range of motion and good stability and symmetric strength and tone of shoulders, elbows, wrist, hip, knee and ankles bilaterally.  Back -low back exam does have some significant loss of lordosis.  Tightness with straight leg test.  Tightness with FABER test.  Severe tenderness over the right piriformis area.  Causes some of the radicular symptoms into the right calf.  Osteopathic findings   T9 extended rotated and side bent left L2 flexed rotated and side bent right Sacrum right on right  Procedure: Real-time Ultrasound Guided Injection of right piriformis tendon sheath Device: GE Logiq Q7 Ultrasound guided injection is preferred based studies that show increased duration, increased effect, greater accuracy, decreased procedural pain, increased response rate, and decreased cost with ultrasound guided versus blind injection.  Verbal informed consent obtained.  Time-out conducted.  Noted no overlying erythema, induration, or other signs of local infection.  Skin prepped in a  sterile fashion.  Local anesthesia: Topical Ethyl chloride.  With sterile technique and under real time ultrasound guidance: With a 21-gauge 2 inch needle injected with 0.5 cc of 0.5% Marcaine and 1 cc of Kenalog 40 mg/mL Completed without difficulty  Pain immediately resolved suggesting accurate placement of the medication.  Advised to call if fevers/chills, erythema, induration, drainage, or persistent bleeding.  Impression: Technically successful ultrasound guided injection.    Assessment and Plan:  Piriformis syndrome, right Repeat injection given again today.  Tolerated the procedure well.  Discussed icing regimen and home exercises.  Ablation.  Discussed posture and ergonomics.  Increase activity slowly.  Follow-up again in 6 to 8 weeks  Lumbar radiculopathy Could be contributing as well.  Discussed icing regimen and home exercises, avoiding certain activities, increase activity slowly.  Worsening pain to consider the possibility of another epidural.  We will refill of hydrocodone and gabapentin.  Follow-up with me again 6 to 8 weeks   Nonallopathic problems  Decision today to treat with OMT was based on Physical Exam  After verbal consent patient was treated with HVLA, ME, FPR techniques in  thoracic, lumbar, and sacral  areas  Patient tolerated the procedure well with improvement in symptoms  Patient given exercises, stretches and lifestyle modifications  See medications in patient instructions if given  Patient will follow up in 4-8 weeks      The above documentation has been reviewed and is accurate and complete [name], DO        Note: This dictation was prepared with Dragon dictation along with smaller phrase technology. Any transcriptional errors that result from this process are unintentional.

## 2020-08-28 ENCOUNTER — Ambulatory Visit (INDEPENDENT_AMBULATORY_CARE_PROVIDER_SITE_OTHER): Payer: 59 | Admitting: Family Medicine

## 2020-08-28 ENCOUNTER — Other Ambulatory Visit: Payer: Self-pay

## 2020-08-28 ENCOUNTER — Encounter: Payer: Self-pay | Admitting: Family Medicine

## 2020-08-28 ENCOUNTER — Ambulatory Visit: Payer: Self-pay

## 2020-08-28 VITALS — BP 124/90 | HR 66 | Ht 73.0 in | Wt 249.0 lb

## 2020-08-28 DIAGNOSIS — M9903 Segmental and somatic dysfunction of lumbar region: Secondary | ICD-10-CM

## 2020-08-28 DIAGNOSIS — M9902 Segmental and somatic dysfunction of thoracic region: Secondary | ICD-10-CM | POA: Diagnosis not present

## 2020-08-28 DIAGNOSIS — M9901 Segmental and somatic dysfunction of cervical region: Secondary | ICD-10-CM

## 2020-08-28 DIAGNOSIS — M5416 Radiculopathy, lumbar region: Secondary | ICD-10-CM | POA: Diagnosis not present

## 2020-08-28 DIAGNOSIS — M9904 Segmental and somatic dysfunction of sacral region: Secondary | ICD-10-CM | POA: Diagnosis not present

## 2020-08-28 DIAGNOSIS — G5701 Lesion of sciatic nerve, right lower limb: Secondary | ICD-10-CM

## 2020-08-28 DIAGNOSIS — M25551 Pain in right hip: Secondary | ICD-10-CM | POA: Diagnosis not present

## 2020-08-28 MED ORDER — HYDROCODONE-ACETAMINOPHEN 5-325 MG PO TABS: 1.0000 | ORAL_TABLET | Freq: Three times a day (TID) | ORAL | 0 refills | Status: DC | PRN

## 2020-08-28 MED ORDER — GABAPENTIN 100 MG PO CAPS
200.0000 mg | ORAL_CAPSULE | Freq: Every day | ORAL | 0 refills | Status: DC
Start: 2020-08-28 — End: 2020-11-22

## 2020-08-28 NOTE — Assessment & Plan Note (Signed)
Repeat injection given again today.  Tolerated the procedure well.  Discussed icing regimen and home exercises.  Ablation.  Discussed posture and ergonomics.  Increase activity slowly.  Follow-up again in 6 to 8 weeks

## 2020-08-28 NOTE — Patient Instructions (Signed)
Injected the piriformis If not better in 2 weeks call me and we will order epidural  Sent in vicodin and gabapentin  See me again in 8 weeks

## 2020-08-28 NOTE — Assessment & Plan Note (Signed)
Could be contributing as well.  Discussed icing regimen and home exercises, avoiding certain activities, increase activity slowly.  Worsening pain to consider the possibility of another epidural.  We will refill of hydrocodone and gabapentin.  Follow-up with me again 6 to 8 weeks

## 2020-09-20 ENCOUNTER — Other Ambulatory Visit: Payer: Self-pay | Admitting: Internal Medicine

## 2020-09-20 ENCOUNTER — Other Ambulatory Visit: Payer: Self-pay | Admitting: *Deleted

## 2020-09-20 DIAGNOSIS — K219 Gastro-esophageal reflux disease without esophagitis: Secondary | ICD-10-CM

## 2020-09-20 DIAGNOSIS — R1013 Epigastric pain: Secondary | ICD-10-CM

## 2020-09-20 MED ORDER — DEXLANSOPRAZOLE 60 MG PO CPDR
60.0000 mg | DELAYED_RELEASE_CAPSULE | Freq: Every day | ORAL | 1 refills | Status: DC
Start: 2020-09-20 — End: 2020-09-20

## 2020-09-20 MED ORDER — DEXLANSOPRAZOLE 60 MG PO CPDR: 60.0000 mg | DELAYED_RELEASE_CAPSULE | Freq: Every day | ORAL | 1 refills | Status: DC

## 2020-09-20 MED ORDER — SUCRALFATE 1 GM/10ML PO SUSP
1.0000 g | Freq: Three times a day (TID) | ORAL | 1 refills | Status: DC
Start: 2020-09-20 — End: 2021-10-21

## 2020-09-20 NOTE — Progress Notes (Signed)
Patient reports recent stress with mother's health Considerable burning epigastric and chest pain consistent with pyrosis/heartburn No dyspnea and not exertional. Using Mylanta and Rolaids which relieves pain temporarily  We will try Dexilant 60 mg once daily for 30 days, then he likely can return to pantoprazole Famotidine 20 mg twice daily can be used as needed for breakthrough symptoms Liquid sucralfate 1 g 3 times daily before meals and at bedtime for 2 to 4 weeks as needed

## 2020-10-09 ENCOUNTER — Other Ambulatory Visit: Payer: Self-pay

## 2020-10-09 ENCOUNTER — Ambulatory Visit (INDEPENDENT_AMBULATORY_CARE_PROVIDER_SITE_OTHER): Payer: 59 | Admitting: Family Medicine

## 2020-10-09 VITALS — BP 102/76 | HR 72 | Ht 73.0 in | Wt 251.0 lb

## 2020-10-09 DIAGNOSIS — M9903 Segmental and somatic dysfunction of lumbar region: Secondary | ICD-10-CM

## 2020-10-09 DIAGNOSIS — M9908 Segmental and somatic dysfunction of rib cage: Secondary | ICD-10-CM

## 2020-10-09 DIAGNOSIS — M5416 Radiculopathy, lumbar region: Secondary | ICD-10-CM

## 2020-10-09 DIAGNOSIS — M9904 Segmental and somatic dysfunction of sacral region: Secondary | ICD-10-CM | POA: Diagnosis not present

## 2020-10-09 DIAGNOSIS — M9901 Segmental and somatic dysfunction of cervical region: Secondary | ICD-10-CM

## 2020-10-09 DIAGNOSIS — M9902 Segmental and somatic dysfunction of thoracic region: Secondary | ICD-10-CM | POA: Diagnosis not present

## 2020-10-09 NOTE — Assessment & Plan Note (Signed)
Continued mild radicular symptoms.  Discussed the gabapentin which patient has been taking.  Discussed icing regimen and home exercises.  Responds well though to manipulation.  Follow-up again in 6 to 8 weeks

## 2020-10-09 NOTE — Patient Instructions (Signed)
Good to see you Keep being active Consider epidural See me in 6 weeks

## 2020-10-09 NOTE — Progress Notes (Signed)
[place] Phone: [phone] Subjective:   [name], am serving as a scribe for Dr. Hulan Saas. This visit occurred during the SARS-CoV-2 public health emergency.  Safety protocols were in place, including screening questions prior to the visit, additional usage of staff PPE, and extensive cleaning of exam room while observing appropriate contact time as indicated for disinfecting solutions.   This visit occurred during the SARS-CoV-2 public health emergency.  Safety protocols were in place, including screening questions prior to the visit, additional usage of staff PPE, and extensive cleaning of exam room while observing appropriate contact time as indicated for disinfecting solutions.   I'm seeing this patient by the request  of:  [name], MD  CC: Neck and low back pain follow-up  [id number]  Kyle [name] is a 50 y.o. male coming in with complaint of back and neck pain. OMT 08/28/2020. Patient states that injection last visit did help some what. Pain persist in R hamstring. Pain is worse with sitting. Patient last took [place] 2 months ago.  Patient feels like there is more radicular symptoms coming from his back prematurely hamstring injury.  He does feel that the piriformis was helpful though with the injection.  Medications patient has been prescribed: Norco, Gabapentin  Taking: Yes         Past Medical History:  Diagnosis Date   Allergy    Arthritis    Asthma    Cataract    small right eye    Constipation    Diverticulosis    Family history of colonic polyps    dad and sister    GERD (gastroesophageal reflux disease)    Neuromuscular disorder (HCC)    spine   Neuropathy of foot    right and mild    Allergies  Allergen Reactions   Azithromycin Other (See Comments)    Possible elevated LFTs Elevated liver enzymes Possible elevated LFTs    Cyclosporine Hives    Meperidine Hives   Penicillins Swelling    Neck swelling      Review of Systems:  No headache, visual changes, nausea, vomiting, diarrhea, constipation, dizziness, abdominal pain, skin rash, fevers, chills, night sweats, weight loss, swollen lymph nodes, body aches, joint swelling, chest pain, shortness of breath, mood changes. POSITIVE muscle aches  Objective  Blood pressure 102/76, pulse 72, height '6\' 1"'$  (1.854 m), weight 251 lb (113.9 kg), SpO2 97 %.   General: No apparent distress alert and oriented x3 mood and affect normal, dressed appropriately.  HEENT: Pupils equal, extraocular movements intact  Respiratory: Patient's speak in full sentences and does not appear short of breath  Cardiovascular: No lower extremity edema, non tender, no erythema  Neck exam does have some loss of lordosis.  Some limited sidebending bilaterally.  Mild crepitus noted range of motion.  5 out of 5 strength of the upper extremities Low back exam does show the patient does have a mild positive straight leg test on the right side but no significant weakness noted.  Less tenderness in the still some especially with FABER test.  Mild pain at the origin of the hamstrings but nothing significant.  No pain with resisted flexion of the knee in the hamstring area.  Osteopathic findings  C2 flexed rotated and side bent right C6 flexed rotated and side bent left T3 extended rotated and side bent right inhaled rib T9 extended rotated and side bent left L2 flexed  rotated and side bent right L5 flexed rotated and side bent right Sacrum right on right       Assessment and Plan:  Lumbar radiculopathy Continued mild radicular symptoms.  Discussed the gabapentin which patient has been taking.  Discussed icing regimen and home exercises.  Responds well though to manipulation.  Follow-up again in 6 to 8 weeks   Nonallopathic problems  Decision today to treat with OMT was based on Physical Exam  After verbal  consent patient was treated with HVLA, ME, FPR techniques in cervical, rib, thoracic, lumbar, and sacral  areas only did muscle energy on the neck secondary to history of surgery but patient did have increase in tightness noted today.  Patient tolerated the procedure well with improvement in symptoms  Patient given exercises, stretches and lifestyle modifications  See medications in patient instructions if given  Patient will follow up in 4-8 weeks      The above documentation has been reviewed and is accurate and complete [name], DO       Note: This dictation was prepared with Dragon dictation along with smaller phrase technology. Any transcriptional errors that result from this process are unintentional.

## 2020-10-10 ENCOUNTER — Encounter: Payer: Self-pay | Admitting: Family Medicine

## 2020-11-21 NOTE — Progress Notes (Signed)
[name] [address] [place] Phone: [phone] Subjective:   [name], am serving as a scribe for Dr. Hulan Saas. This visit occurred during the SARS-CoV-2 public health emergency.  Safety protocols were in place, including screening questions prior to the visit, additional usage of staff PPE, and extensive cleaning of exam room while observing appropriate contact time as indicated for disinfecting solutions.   I'm seeing this patient by the request  of:  [name], MD  CC:   [id number]  Kyle [name] is a 50 y.o. male coming in with complaint of back and neck pain. OMT on 08/28/2020. He was also seen for his piriformis. Repeat injection given again today.  Tolerated the procedure well.  Discussed icing regimen and home exercises.  Ablation.  Discussed posture and ergonomics.  Increase activity slowly.  Follow-up again in 6 to 8 weeks  Patient states pain is the same. Hip pain has gotten better.  Medications patient has been prescribed:   Taking:         Reviewed prior external information including notes and imaging from previsou exam, outside providers and external EMR if available.   As well as notes that were available from care everywhere and other healthcare systems.  Past medical history, social, surgical and family history all reviewed in electronic medical record.  No pertanent information unless stated regarding to the chief complaint.   Past Medical History:  Diagnosis Date   Allergy    Arthritis    Asthma    Cataract    small right eye    Constipation    Diverticulosis    Family history of colonic polyps    dad and sister    GERD (gastroesophageal reflux disease)    Neuromuscular disorder (HCC)    spine   Neuropathy of foot    right and mild    Allergies  Allergen Reactions   Azithromycin Other (See Comments)    Possible elevated LFTs Elevated liver enzymes Possible elevated  LFTs    Cyclosporine Hives   Meperidine Hives   Penicillins Swelling    Neck swelling      Review of Systems:  No headache, visual changes, nausea, vomiting, diarrhea, constipation, dizziness, abdominal pain, skin rash, fevers, chills, night sweats, weight loss, swollen lymph nodes, body aches, joint swelling, chest pain, shortness of breath, mood changes. POSITIVE muscle aches  Objective  Blood pressure 128/78, pulse 73, height '6\' 1"'$  (1.854 m), weight 250 lb (113.4 kg), SpO2 99 %.   General: No apparent distress alert and oriented x3 mood and affect normal, dressed appropriately.  HEENT: Pupils equal, extraocular movements intact  Respiratory: Patient's speak in full sentences and does not appear short of breath  Cardiovascular: No lower extremity edema, non tender, no erythema  Neuro: Cranial nerves II through XII are intact, neurovascularly intact in all extremities with 2+ DTRs and 2+ pulses.  Gait normal with good balance and coordination.  MSK:  Non tender with full range of motion and good stability and symmetric strength and tone of shoulders, elbows, wrist, hip, knee and ankles bilaterally.  Back -low back exam though on inspection does show the patient does have some either nummular eczema versus patches of potential psoriatic changes.  Low back exam does some some tightness noted.  Seems to be more at the thoracolumbar juncture and minorly over the right side.  Patient still has tightness of the piriformis bilaterally.  Tightness with straight leg  test of the hamstrings but no true radicular symptoms.  Neck exam does have some limited extension and sidebending bilaterally.  Osteopathic findings  T9 extended rotated and side bent left L2 flexed rotated and side bent right Sacrum right on right       Assessment and Plan:    Nonallopathic problems  Decision today to treat with OMT was based on Physical Exam  After verbal consent patient was treated with HVLA, ME,  FPR techniques in cervical, rib, thoracic, lumbar, and sacral  areas  Patient tolerated the procedure well with improvement in symptoms  Patient given exercises, stretches and lifestyle modifications  See medications in patient instructions if given  Patient will follow up in 4-8 weeks      The above documentation has been reviewed and is accurate and complete [name], DO        Note: This dictation was prepared with Dragon dictation along with smaller phrase technology. Any transcriptional errors that result from this process are unintentional.

## 2020-11-22 ENCOUNTER — Other Ambulatory Visit: Payer: Self-pay

## 2020-11-22 ENCOUNTER — Ambulatory Visit (INDEPENDENT_AMBULATORY_CARE_PROVIDER_SITE_OTHER): Payer: 59 | Admitting: Family Medicine

## 2020-11-22 VITALS — BP 128/78 | HR 73 | Ht 73.0 in | Wt 250.0 lb

## 2020-11-22 DIAGNOSIS — L308 Other specified dermatitis: Secondary | ICD-10-CM

## 2020-11-22 DIAGNOSIS — L309 Dermatitis, unspecified: Secondary | ICD-10-CM | POA: Diagnosis not present

## 2020-11-22 DIAGNOSIS — M9903 Segmental and somatic dysfunction of lumbar region: Secondary | ICD-10-CM | POA: Diagnosis not present

## 2020-11-22 DIAGNOSIS — M5416 Radiculopathy, lumbar region: Secondary | ICD-10-CM | POA: Diagnosis not present

## 2020-11-22 DIAGNOSIS — M9904 Segmental and somatic dysfunction of sacral region: Secondary | ICD-10-CM

## 2020-11-22 DIAGNOSIS — M9902 Segmental and somatic dysfunction of thoracic region: Secondary | ICD-10-CM | POA: Diagnosis not present

## 2020-11-22 DIAGNOSIS — M2550 Pain in unspecified joint: Secondary | ICD-10-CM | POA: Diagnosis not present

## 2020-11-22 MED ORDER — GABAPENTIN 100 MG PO CAPS: 200.0000 mg | ORAL_CAPSULE | Freq: Every day | ORAL | 0 refills | Status: DC

## 2020-11-22 NOTE — Patient Instructions (Signed)
See you again in 6-8 weeks

## 2020-11-22 NOTE — Assessment & Plan Note (Addendum)
Patient does have eczema that seems to be expanding.  Patient is concerned with patient's father having psoriasis.  Do feel at this point refer to dermatology for further evaluation.  We will get laboratory work-up to further evaluate for any type of autoimmune.  Patient does have more active of the active lesions on the extensor surfaces.  Mild over the flexor aspect of the knees but also on the axial.

## 2020-11-23 LAB — CBC WITH DIFFERENTIAL/PLATELET
Basophils Absolute: 0.1 10*3/uL (ref 0.0–0.1)
Basophils Relative: 1.2 % (ref 0.0–3.0)
Eosinophils Absolute: 0.2 10*3/uL (ref 0.0–0.7)
Eosinophils Relative: 2.9 % (ref 0.0–5.0)
HCT: 42.5 % (ref 39.0–52.0)
Hemoglobin: 14.7 g/dL (ref 13.0–17.0)
Lymphocytes Relative: 24.7 % (ref 12.0–46.0)
Lymphs Abs: 1.5 10*3/uL (ref 0.7–4.0)
MCHC: 34.7 g/dL (ref 30.0–36.0)
MCV: 91.8 fl (ref 78.0–100.0)
Monocytes Absolute: 0.5 10*3/uL (ref 0.1–1.0)
Monocytes Relative: 7.3 % (ref 3.0–12.0)
Neutro Abs: 4 10*3/uL (ref 1.4–7.7)
Neutrophils Relative %: 63.9 % (ref 43.0–77.0)
Platelets: 291 10*3/uL (ref 150.0–400.0)
RBC: 4.63 Mil/uL (ref 4.22–5.81)
RDW: 13.4 % (ref 11.5–15.5)
WBC: 6.3 10*3/uL (ref 4.0–10.5)

## 2020-11-23 LAB — SEDIMENTATION RATE: Sed Rate: 5 mm/hr (ref 0–20)

## 2020-11-23 LAB — URIC ACID: Uric Acid, Serum: 6.6 mg/dL (ref 4.0–7.8)

## 2020-11-23 LAB — VITAMIN D 25 HYDROXY (VIT D DEFICIENCY, FRACTURES): VITD: 31.34 ng/mL (ref 30.00–100.00)

## 2020-11-23 NOTE — Assessment & Plan Note (Signed)
Patient likely is not having any true radicular symptoms.  Refilled patient's gabapentin which does seem to be making a difference.  Discussed icing regimen and home exercises.  Patient does have some eczema noted as well and will get laboratory work-up.  Follow-up again in 4 to 8 weeks

## 2020-11-24 LAB — RHEUMATOID FACTOR: Rheumatoid fact SerPl-aCnc: <14 IU/mL (ref <14)

## 2020-11-24 LAB — ANA: Anti Nuclear Antibody (ANA): NEGATIVE

## 2020-11-24 LAB — CYCLIC CITRUL PEPTIDE ANTIBODY, IGG: Cyclic Citrullin Peptide Ab: <16 UNITS

## 2020-11-28 ENCOUNTER — Telehealth: Payer: Self-pay | Admitting: Family Medicine

## 2020-11-28 LAB — ANA+ENA+DNA/DS+SCL 70+SJOSSA/B
ANA Titer 1: NEGATIVE
ENA RNP Ab: 0.3 AI (ref 0.0–0.9)
ENA SM Ab Ser-aCnc: <0.2 AI (ref 0.0–0.9)
ENA SSA (RO) Ab: <0.2 AI (ref 0.0–0.9)
ENA SSB (LA) Ab: <0.2 AI (ref 0.0–0.9)
Scleroderma (Scl-70) (ENA) Antibody, IgG: <0.2 AI (ref 0.0–0.9)
dsDNA Ab: <1 IU/mL (ref 0–9)

## 2020-11-28 NOTE — Telephone Encounter (Signed)
If improving fine with waiting. Not likely going to change much with it likely being psoriasis vs eczema.  If it worsens would have him call us or call them to see if any cancellation

## 2020-11-28 NOTE — Telephone Encounter (Signed)
Patient called stating that the dermatologist that Dr Tamala Julian referred him to for his Psoriasis can not see him until March. He said that the rash has cleared up some and seems to come up only when the weather changes. He asked if he still needed to plan to see dermatology? If so, is he okay to wait until March or would Dr Tamala Julian want to refer him somewhere else?

## 2020-11-28 NOTE — Telephone Encounter (Signed)
MyChart message sent to patient.

## 2020-12-28 NOTE — Progress Notes (Deleted)
[facility] [address] [place] Phone: [phone] Subjective:    I'm seeing this patient by the request  of:  [name], MD  CC:   [id number]  Kyle [name] is a 50 y.o. male coming in with complaint of back and neck pain. OMT 11/22/2020. Patient states   Medications patient has been prescribed: Gabapentin  Taking:         Reviewed prior external information including notes and imaging from previsou exam, outside providers and external EMR if available.   As well as notes that were available from care everywhere and other healthcare systems.  Past medical history, social, surgical and family history all reviewed in electronic medical record.  No pertanent information unless stated regarding to the chief complaint.   Past Medical History:  Diagnosis Date   Allergy    Arthritis    Asthma    Cataract    small right eye    Constipation    Diverticulosis    Family history of colonic polyps    dad and sister    GERD (gastroesophageal reflux disease)    Neuromuscular disorder (HCC)    spine   Neuropathy of foot    right and mild    Allergies  Allergen Reactions   Azithromycin Other (See Comments)    Possible elevated LFTs Elevated liver enzymes Possible elevated LFTs    Cyclosporine Hives   Meperidine Hives   Penicillins Swelling    Neck swelling      Review of Systems:  No headache, visual changes, nausea, vomiting, diarrhea, constipation, dizziness, abdominal pain, skin rash, fevers, chills, night sweats, weight loss, swollen lymph nodes, body aches, joint swelling, chest pain, shortness of breath, mood changes. POSITIVE muscle aches  Objective  There were no vitals taken for this visit.   General: No apparent distress alert and oriented x3 mood and affect normal, dressed appropriately.  HEENT: Pupils equal, extraocular movements intact  Respiratory: Patient's speak in full sentences and  does not appear short of breath  Cardiovascular: No lower extremity edema, non tender, no erythema  Neuro: Cranial nerves II through XII are intact, neurovascularly intact in all extremities with 2+ DTRs and 2+ pulses.  Gait normal with good balance and coordination.  MSK:  Non tender with full range of motion and good stability and symmetric strength and tone of shoulders, elbows, wrist, hip, knee and ankles bilaterally.  Back - Normal skin, Spine with normal alignment and no deformity.  No tenderness to vertebral process palpation.  Paraspinous muscles are not tender and without spasm.   Range of motion is full at neck and lumbar sacral regions  Osteopathic findings  C2 flexed rotated and side bent right C6 flexed rotated and side bent left T3 extended rotated and side bent right inhaled rib T9 extended rotated and side bent left L2 flexed rotated and side bent right Sacrum right on right       Assessment and Plan:    Nonallopathic problems  Decision today to treat with OMT was based on Physical Exam  After verbal consent patient was treated with HVLA, ME, FPR techniques in cervical, rib, thoracic, lumbar, and sacral  areas  Patient tolerated the procedure well with improvement in symptoms  Patient given exercises, stretches and lifestyle modifications  See medications in patient instructions if given  Patient will follow up in 4-8 weeks      The above documentation has been reviewed and is accurate  and complete Jacqualin Combes       Note: This dictation was prepared with Dragon dictation along with smaller phrase technology. Any transcriptional errors that result from this process are unintentional.

## 2021-01-02 NOTE — Progress Notes (Signed)
[facility] [place] [address] [place] Phone: [phone] Subjective:   [name], am serving as a scribe for Dr. Hulan Saas. This visit occurred during the SARS-CoV-2 public health emergency.  Safety protocols were in place, including screening questions prior to the visit, additional usage of staff PPE, and extensive cleaning of exam room while observing appropriate contact time as indicated for disinfecting solutions.   I'm seeing this patient by the request  of:  [name], MD  CC: Buttocks pain  [id number]  ANOTHY BUFANO is a 50 y.o. male coming in with complaint of R piriformis pain. Patient last injection 08/28/2020. Patient states pain is beginning to come back. Same spot and same type of pain. No new complaints.  Patient has had piriformis syndrome multiple times and has responded extremely well to injections.  Patient is having worsening pain again.  He feels it is more of the piriformis than the lumbar radiculopathy.  Patient continues on the gabapentin.  Patient is able to be active but unfortunately is starting to have more of the neuropathy noted      Past Medical History:  Diagnosis Date   Allergy    Arthritis    Asthma    Cataract    small right eye    Constipation    Diverticulosis    Family history of colonic polyps    dad and sister    GERD (gastroesophageal reflux disease)    Neuromuscular disorder (HCC)    spine   Neuropathy of foot    right and mild   Past Surgical History:  Procedure Laterality Date   ANTERIOR CRUCIATE LIGAMENT REPAIR     COLONOSCOPY     EXTERNAL EAR SURGERY     KNEE ARTHROSCOPY     lasik     Social History   Socioeconomic History   Marital status: Single    Spouse name: Not on file   Number of children: Not on file   Years of education: Not on file   Highest education level: Not on file  Occupational History   Not on file  Tobacco Use   Smoking status: Former    Smokeless tobacco: Never  Substance and Sexual Activity   Alcohol use: Yes    Comment: socially    Drug use: Never   Sexual activity: Not on file  Other Topics Concern   Not on file  Social History Narrative   Not on file   Social Determinants of Health   Financial Resource Strain: Not on file  Food Insecurity: Not on file  Transportation Needs: Not on file  Physical Activity: Not on file  Stress: Not on file  Social Connections: Not on file   Allergies  Allergen Reactions   Azithromycin Other (See Comments)    Possible elevated LFTs Elevated liver enzymes Possible elevated LFTs    Cyclosporine Hives   Meperidine Hives   Penicillins Swelling    Neck swelling    Family History  Problem Relation Age of Onset   Colon polyps Father    Colon polyps Sister    Colon cancer Paternal Aunt    Colon cancer Cousin    Heart disease Neg Hx    Rectal cancer Neg Hx    Stomach cancer Neg Hx     Current Outpatient Medications (Endocrine & Metabolic):    predniSONE (DELTASONE) 50 MG tablet, Take 1 tablet (50 mg total) by mouth daily.   Current Outpatient  Medications (Respiratory):    albuterol (VENTOLIN HFA) 108 (90 Base) MCG/ACT inhaler, Inhale into the lungs.   cetirizine (ZYRTEC) 10 MG tablet, Take by mouth.  Current Outpatient Medications (Analgesics):    colchicine 0.6 MG tablet, Take 1 tablet (0.6 mg total) by mouth daily.   HYDROcodone-acetaminophen (NORCO/VICODIN) 5-325 MG tablet, Take 1 tablet by mouth every 8 (eight) hours as needed for moderate pain.  Current Outpatient Medications (Hematological):    Cyanocobalamin (VITAMIN B 12 PO), Inject as directed. Every 10 days  Current Outpatient Medications (Other):    ciprofloxacin (CIPRO) 500 MG tablet, Take 1 tablet (500 mg total) by mouth 2 (two) times daily.   CRANBERRY PO, Take by mouth.   cyclobenzaprine (FLEXERIL) 5 MG tablet, Take 1 tablet (5 mg total) by mouth 2 (two) times daily as needed for muscle  spasms.   dexlansoprazole (DEXILANT) 60 MG capsule, Take 1 capsule (60 mg total) by mouth daily.   dicyclomine (BENTYL) 10 MG capsule, Take 2 capsules (20 mg total) by mouth 4 (four) times daily as needed for spasms.   gabapentin (NEURONTIN) 100 MG capsule, Take 2 capsules (200 mg total) by mouth at bedtime.   gabapentin (NEURONTIN) 100 MG capsule, Take 2 capsules (200 mg total) by mouth at bedtime.   Magnesium Oxide 400 MG CAPS, Take by mouth.   Omega-3 1000 MG CAPS, Take by mouth.   ondansetron (ZOFRAN) 4 MG tablet, Take 1 tablet (4 mg total) by mouth every 8 (eight) hours as needed for nausea or vomiting.   pantoprazole (PROTONIX) 40 MG tablet, Take 1 tablet (40 mg total) by mouth daily. Take at least 30 minutes before 1st meal of the day.   phentermine 37.5 MG capsule, Take by mouth.   sucralfate (CARAFATE) 1 GM/10ML suspension, Take 10 mLs (1 g total) by mouth 4 (four) times daily -  with meals and at bedtime.   UNABLE TO FIND, daily. Med Name: Beet Root   UNABLE TO FIND, daily. Med Name: Miralax OTC   valACYclovir (VALTREX) 1000 MG tablet, Take 1 tablet (1,000 mg total) by mouth 2 (two) times daily.   Reviewed prior external information including notes and imaging from  primary care provider As well as notes that were available from care everywhere and other healthcare systems.  Past medical history, social, surgical and family history all reviewed in electronic medical record.  No pertanent information unless stated regarding to the chief complaint.   Review of Systems:  No headache, visual changes, nausea, vomiting, diarrhea, constipation, dizziness, abdominal pain, skin rash, fevers, chills, night sweats, weight loss, swollen lymph nodes, body aches, joint swelling, chest pain, shortness of breath, mood changes. POSITIVE muscle aches  Objective  Blood pressure 122/80, pulse 74, height 6\' 1"  (1.854 m), weight 252 lb (114.3 kg), SpO2 99 %.   General: No apparent distress alert and  oriented x3 mood and affect normal, dressed appropriately.  HEENT: Pupils equal, extraocular movements intact  Respiratory: Patient's speak in full sentences and does not appear short of breath  Cardiovascular: No lower extremity edema, non tender, no erythema  Gait normal with good balance and coordination.  MSK: Low back exam still has tightness noted.  Mild positive straight leg test noted.  Still tenderness over the right piriformis more than usual.  Procedure: Real-time Ultrasound Guided Injection of right piriformis tendon sheath Device: GE Logiq Q7 Ultrasound guided injection is preferred based studies that show increased duration, increased effect, greater accuracy, decreased procedural pain, increased response rate, and  decreased cost with ultrasound guided versus blind injection.  Verbal informed consent obtained.  Time-out conducted.  Noted no overlying erythema, induration, or other signs of local infection.  Skin prepped in a sterile fashion.  Local anesthesia: Topical Ethyl chloride.  With sterile technique and under real time ultrasound guidance: With a 21-gauge 2 inch needle injected into the right piriformis more approximately.  A total of 2 cc of 0.5% Marcaine and 1 cc of Kenalog 40 mg/mL used. Completed without difficulty  Pain immediately improved suggesting accurate placement of the medication.  Advised to call if fevers/chills, erythema, induration, drainage, or persistent bleeding.  Impression: Technically successful ultrasound guided injection.   Impression and Recommendations:     The above documentation has been reviewed and is accurate and complete [name], DO

## 2021-01-08 ENCOUNTER — Ambulatory Visit: Payer: Self-pay

## 2021-01-08 ENCOUNTER — Ambulatory Visit (INDEPENDENT_AMBULATORY_CARE_PROVIDER_SITE_OTHER): Payer: 59 | Admitting: Family Medicine

## 2021-01-08 ENCOUNTER — Encounter: Payer: Self-pay | Admitting: Family Medicine

## 2021-01-08 ENCOUNTER — Other Ambulatory Visit: Payer: Self-pay

## 2021-01-08 VITALS — BP 122/80 | HR 74 | Ht 73.0 in | Wt 252.0 lb

## 2021-01-08 DIAGNOSIS — G5701 Lesion of sciatic nerve, right lower limb: Secondary | ICD-10-CM | POA: Diagnosis not present

## 2021-01-08 NOTE — Assessment & Plan Note (Signed)
Repeat injection given today.  Discussed icing regimen and home exercise, discussed which activities to do and which ones to avoid.  Increase activity slowly.  Follow-up with me again in 6 to 8 weeks.

## 2021-01-08 NOTE — Patient Instructions (Addendum)
Good to see you  You received a steroid injection today. Seek immediate medical attention if the joint becomes red, extremely painful, or is oozing fluid.   Ask about insurance up front  See me again in 8 weeks

## 2021-01-14 ENCOUNTER — Ambulatory Visit: Payer: 59 | Admitting: Family Medicine

## 2021-01-28 ENCOUNTER — Other Ambulatory Visit: Payer: 59

## 2021-01-28 ENCOUNTER — Other Ambulatory Visit: Payer: Self-pay | Admitting: Internal Medicine

## 2021-01-28 DIAGNOSIS — J069 Acute upper respiratory infection, unspecified: Secondary | ICD-10-CM

## 2021-01-28 NOTE — Progress Notes (Signed)
Flu-like symptoms Flu swab today

## 2021-02-13 ENCOUNTER — Other Ambulatory Visit: Payer: Self-pay | Admitting: Internal Medicine

## 2021-02-26 NOTE — Progress Notes (Signed)
[name] [address] [place] Phone: [phone] Subjective:   [name], am serving as a scribe for Dr. Hulan Saas. This visit occurred during the SARS-CoV-2 public health emergency.  Safety protocols were in place, including screening questions prior to the visit, additional usage of staff PPE, and extensive cleaning of exam room while observing appropriate contact time as indicated for disinfecting solutions.   I'm seeing this patient by the request  of:  [name], MD  CC: Back pain follow-up  [id number]  01/08/2021 Repeat injection given today.  Discussed icing regimen and home exercise, discussed which activities to do and which ones to avoid.  Increase activity slowly.  Follow-up with me again in 6 to 8 weeks.  Updated 02/27/2021 Kyle [name] is a 50 y.o. male coming in with complaint of piriformis pain. Less intense and frequent in pain. Would like OMT today. Injection helped.  Patient states that he still has some tightness noted.  Has had some increasing stress with illnesses in the family.  States continues to have tightness overall.       Past Medical History:  Diagnosis Date   Allergy    Arthritis    Asthma    Cataract    small right eye    Constipation    Diverticulosis    Family history of colonic polyps    dad and sister    GERD (gastroesophageal reflux disease)    Neuromuscular disorder (HCC)    spine   Neuropathy of foot    right and mild   Past Surgical History:  Procedure Laterality Date   ANTERIOR CRUCIATE LIGAMENT REPAIR     COLONOSCOPY     EXTERNAL EAR SURGERY     KNEE ARTHROSCOPY     lasik     Social History   Socioeconomic History   Marital status: Single    Spouse name: Not on file   Number of children: Not on file   Years of education: Not on file   Highest education level: Not on file  Occupational History   Not on file  Tobacco Use   Smoking status: Former    Smokeless tobacco: Never  Substance and Sexual Activity   Alcohol use: Yes    Comment: socially    Drug use: Never   Sexual activity: Not on file  Other Topics Concern   Not on file  Social History Narrative   Not on file   Social Determinants of Health   Financial Resource Strain: Not on file  Food Insecurity: Not on file  Transportation Needs: Not on file  Physical Activity: Not on file  Stress: Not on file  Social Connections: Not on file   Allergies  Allergen Reactions   Azithromycin Other (See Comments)    Possible elevated LFTs Elevated liver enzymes Possible elevated LFTs    Cyclosporine Hives   Meperidine Hives   Penicillins Swelling    Neck swelling    Family History  Problem Relation Age of Onset   Colon polyps Father    Colon polyps Sister    Colon cancer Paternal Aunt    Colon cancer Cousin    Heart disease Neg Hx    Rectal cancer Neg Hx    Stomach cancer Neg Hx     Current Outpatient Medications (Endocrine & Metabolic):    predniSONE (DELTASONE) 50 MG tablet, Take 1 tablet (50 mg total) by mouth daily.   Current Outpatient Medications (  Respiratory):    albuterol (VENTOLIN HFA) 108 (90 Base) MCG/ACT inhaler, Inhale into the lungs.   cetirizine (ZYRTEC) 10 MG tablet, Take by mouth.  Current Outpatient Medications (Analgesics):    colchicine 0.6 MG tablet, Take 1 tablet (0.6 mg total) by mouth daily.   HYDROcodone-acetaminophen (NORCO/VICODIN) 5-325 MG tablet, Take 1 tablet by mouth every 8 (eight) hours as needed for moderate pain.  Current Outpatient Medications (Hematological):    Cyanocobalamin (VITAMIN B 12 PO), Inject as directed. Every 10 days  Current Outpatient Medications (Other):    ciprofloxacin (CIPRO) 500 MG tablet, Take 1 tablet (500 mg total) by mouth 2 (two) times daily.   CRANBERRY PO, Take by mouth.   cyclobenzaprine (FLEXERIL) 5 MG tablet, Take 1 tablet (5 mg total) by mouth 2 (two) times daily as needed for muscle  spasms.   dexlansoprazole (DEXILANT) 60 MG capsule, Take 1 capsule (60 mg total) by mouth daily.   dicyclomine (BENTYL) 10 MG capsule, Take 2 capsules (20 mg total) by mouth 4 (four) times daily as needed for spasms.   gabapentin (NEURONTIN) 100 MG capsule, Take 2 capsules (200 mg total) by mouth at bedtime.   gabapentin (NEURONTIN) 100 MG capsule, Take 2 capsules (200 mg total) by mouth at bedtime.   Magnesium Oxide 400 MG CAPS, Take by mouth.   Omega-3 1000 MG CAPS, Take by mouth.   ondansetron (ZOFRAN) 4 MG tablet, Take 1 tablet (4 mg total) by mouth every 8 (eight) hours as needed for nausea or vomiting.   pantoprazole (PROTONIX) 40 MG tablet, Take 1 tablet (40 mg total) by mouth daily. Take at least 30 minutes before 1st meal of the day.   phentermine 37.5 MG capsule, Take by mouth.   sucralfate (CARAFATE) 1 GM/10ML suspension, Take 10 mLs (1 g total) by mouth 4 (four) times daily -  with meals and at bedtime.   UNABLE TO FIND, daily. Med Name: Beet Root   UNABLE TO FIND, daily. Med Name: Miralax OTC   valACYclovir (VALTREX) 1000 MG tablet, Take 1 tablet (1,000 mg total) by mouth 2 (two) times daily.   Reviewed prior external information including notes and imaging from  primary care provider As well as notes that were available from care everywhere and other healthcare systems.  Past medical history, social, surgical and family history all reviewed in electronic medical record.  No pertanent information unless stated regarding to the chief complaint.   Review of Systems:  No headache, visual changes, nausea, vomiting, diarrhea, constipation, dizziness, abdominal pain, skin rash, fevers, chills, night sweats, weight loss, swollen lymph nodes, body aches, joint swelling, chest pain, shortness of breath, mood changes. POSITIVE muscle aches  Objective  Blood pressure 132/90, height 6\' 1"  (1.854 m), weight 252 lb (114.3 kg).   General: No apparent distress alert and oriented x3 mood and  affect normal, dressed appropriately.  HEENT: Pupils equal, extraocular movements intact  Respiratory: Patient's speak in full sentences and does not appear short of breath  Cardiovascular: No lower extremity edema, non tender, no erythema  Gait normal with good balance and coordination.  MSK: Low back exam does have some loss of lordosis.  Continues to have tightness in the piriformis but seems to be bilateral.  Patient does have tightness with FABER test.  Mild loss of lordosis of the lumbar spine.  Negative straight leg test.  Does have some limited range of motion of the neck noted.  Osteopathic findings  C6 flexed rotated and side bent left  T3 extended rotated and side bent right inhaled third rib T9 extended rotated and side bent left L2 flexed rotated and side bent right L5 flexed rotated and side bent left Sacrum right on right     Impression and Recommendations:     The above documentation has been reviewed and is accurate and complete [name], DO

## 2021-02-27 ENCOUNTER — Ambulatory Visit (INDEPENDENT_AMBULATORY_CARE_PROVIDER_SITE_OTHER): Payer: 59 | Admitting: Family Medicine

## 2021-02-27 ENCOUNTER — Ambulatory Visit: Payer: Self-pay

## 2021-02-27 ENCOUNTER — Other Ambulatory Visit: Payer: Self-pay

## 2021-02-27 VITALS — BP 132/90 | Ht 73.0 in | Wt 252.0 lb

## 2021-02-27 DIAGNOSIS — M9902 Segmental and somatic dysfunction of thoracic region: Secondary | ICD-10-CM | POA: Diagnosis not present

## 2021-02-27 DIAGNOSIS — M9901 Segmental and somatic dysfunction of cervical region: Secondary | ICD-10-CM | POA: Diagnosis not present

## 2021-02-27 DIAGNOSIS — M9904 Segmental and somatic dysfunction of sacral region: Secondary | ICD-10-CM

## 2021-02-27 DIAGNOSIS — G5701 Lesion of sciatic nerve, right lower limb: Secondary | ICD-10-CM | POA: Diagnosis not present

## 2021-02-27 DIAGNOSIS — M9908 Segmental and somatic dysfunction of rib cage: Secondary | ICD-10-CM

## 2021-02-27 DIAGNOSIS — M9903 Segmental and somatic dysfunction of lumbar region: Secondary | ICD-10-CM

## 2021-02-28 NOTE — Assessment & Plan Note (Signed)
Continue tightness noted.  Patient does respond well though to osteopathic manipulation.  Has done well with the gabapentin.  Occasionally does have to use hydrocodone for pain.  Very seldomly.  Has also had had improvement with the piriformis injections.  Follow-up again in 3 months

## 2021-03-28 ENCOUNTER — Telehealth: Payer: Self-pay | Admitting: *Deleted

## 2021-03-28 NOTE — Telephone Encounter (Signed)
[employer] has denied patient's pantoprazole. "We have declined to provide benefits, in whole or in part, for the requested treatment or services described.... The request does not meet the definition of Medical Necessity found in the members benefit booklet.... This medication is covered when two over the counter generic proton pump inhibitors (such as omeprazole, lansoprazole, esomeprazole) "  (Patient has tried and failed omeprazole)  ID Number: [id number] Reference # [facility]  Dr Hilarie Fredrickson, please advise.Marland KitchenMarland KitchenMarland Kitchen

## 2021-04-01 NOTE — Telephone Encounter (Signed)
He has tried OTC omeprazole and esomeprazole and pantoprazole works better for him

## 2021-04-02 NOTE — Telephone Encounter (Signed)
I re attempted prior authorization with the additional information that patient has also tried and failed over the counter esomeprazole.  Authorization has now been obtained for pantoprazole.    Gwyndolyn Saxon [place] Key: [id number] help? Call us at [phone] Outcome Approvedtoday Effective from 04/02/2021 through 04/01/2022. Drug Pantoprazole Sodium 40MG  dr tablets Form Blue Building control surveyor Form (CB)

## 2021-04-19 ENCOUNTER — Other Ambulatory Visit: Payer: Self-pay | Admitting: Internal Medicine

## 2021-04-19 DIAGNOSIS — J069 Acute upper respiratory infection, unspecified: Secondary | ICD-10-CM

## 2021-04-19 MED ORDER — METHYLPREDNISOLONE 4 MG PO TBPK: ORAL_TABLET | ORAL | 0 refills | Status: DC

## 2021-04-19 NOTE — Progress Notes (Signed)
URI, reactive airways, mild wheezing No dyspnea, normal sats Lungs clear  Medrol dose pack x 1

## 2021-04-25 ENCOUNTER — Ambulatory Visit: Payer: 59 | Admitting: Family Medicine

## 2021-05-08 NOTE — Progress Notes (Signed)
?Charlann Boxer D.O. ?[facility] ?[place] ?Phone: [phone] ?Subjective:   ?I, [name], am serving as a Education administrator for Dr. Hulan Saas. ?This visit occurred during the SARS-CoV-2 public health emergency.  Safety protocols were in place, including screening questions prior to the visit, additional usage of staff PPE, and extensive cleaning of exam room while observing appropriate contact time as indicated for disinfecting solutions.  ? ?I'm seeing this patient by the request  of:  [name], MD ? ?CC: back and neck pain  ? ?[id number]  ?Kyle [name] is a 51 y.o. male coming in with complaint of back and neck pain. OMT on 02/27/2021. Patient states wants injections today.feels his piriformis is needed, having foot pain as well. Worse at end of day, pain at night as well.  ?Responds to neck and back with OMT usually and tries to stay active  ? ?Medications patient has been prescribed: None ? ?Taking: ? ? ?  ? ? ? ? ?Reviewed prior external information including notes and imaging from previsou exam, outside providers and external EMR if available.  ? ?As well as notes that were available from care everywhere and other healthcare systems. ? ?Past medical history, social, surgical and family history all reviewed in electronic medical record.  No pertanent information unless stated regarding to the chief complaint.  ? ?Past Medical History:  ?Diagnosis Date  ? Allergy   ? Arthritis   ? Asthma   ? Cataract   ? small right eye   ? Constipation   ? Diverticulosis   ? Family history of colonic polyps   ? dad and sister   ? GERD (gastroesophageal reflux disease)   ? Neuromuscular disorder ([place])   ? spine  ? 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  ?Allergies  ?Allergen Reactions  ? Azithromycin Other (See Comments)  ?  Possible elevated LFTs ?Elevated liver enzymes ?Possible elevated LFTs ?  ? Cyclosporine Hives  ? Meperidine Hives  ? Penicillins Swelling  ?  Neck  swelling ?  ? ? ? ?Review of Systems: ? No headache, visual changes, nausea, vomiting, diarrhea, constipation, dizziness, abdominal pain, skin rash, fevers, chills, night sweats, weight loss, swollen lymph nodes, body aches, joint swelling, chest pain, shortness of breath, mood changes. POSITIVE muscle aches ? ?Objective  ?Blood pressure 124/76, pulse 77, height 6\' 1"  (1.854 m), weight 251 lb (113.9 kg), SpO2 98 %. ?  ?General: No apparent distress alert and oriented x3 mood and affect normal, dressed appropriately.  ?HEENT: Pupils equal, extraocular movements intact  ?Respiratory: Patient's speak in full sentences and does not appear short of breath  ?Cardiovascular: No lower extremity edema, non tender, no erythema  ?Right foot exam shows the patient does have a positive squeeze test.  Pain between the second and third metatarsal heads.  Patient has no pain over the first metatarsal.  Mild tightness noted of the ankle bilaterally. ? ? ?Osteopathic findings ? ?C7FRS right  ?T3 extended rotated and side bent right inhaled rib ?T9 extended rotated and side bent left ?L3 flexed rotated and side bent right ?Sacrum right on right ? ?Limited muscular skeletal ultrasound was performed and interpreted by Hulan Saas, M   ?Limited ultrasound the patient's right foot shows the patient does have a hypoechoic changes that is consistent with a neuroma.  No cortical irregularity or any hypoechoic changes of any of the surrounding musculature or joints. ?Impression: Neuroma ? ?Procedure:  Real-time Ultrasound Guided Injection of right foot neuroma ?Device: GE Logiq Q7 ?Ultrasound guided injection is preferred based studies that show increased duration, increased effect, greater accuracy, decreased procedural pain, increased response rate, and decreased cost with ultrasound guided versus blind injection.  ?Verbal informed consent obtained.  ?Time-out conducted.  ?Noted no overlying erythema, induration, or other signs of local  infection.  ?Skin prepped in a sterile fashion.  ?Local anesthesia: Topical Ethyl chloride.  ?With sterile technique and under real time ultrasound guidance: With a 25-gauge half inch needle patient was injected with 0.5 cc of 0.5% Marcaine and 0.5 cc of Kenalog 40 mg/mL. ?Completed without difficulty  ?Pain immediately resolved suggesting accurate placement of the medication.  ?Advised to call if fevers/chills, erythema, induration, drainage, or persistent bleeding.  ?Impression: Technically successful ultrasound guided injection. ? ? ?  ?Assessment and Plan: ? ? ?Piriformis syndrome, right ?Repeat injection given again today.  Tolerated the procedure well.  Still states that he gets the most relief from this more than the epidurals in his back.  We will consider that as well though.  Chronic problem with exacerbation.  Follow-up again in 6 to 8 weeks.  Medication refills per orders ? ?Interdigital neuroma of left foot ?Patient given injection and tolerated the procedure well this was of the right foot.  Discussed which activities to do which ones to avoid, over-the-counter orthotics, follow-up again 6 weeks ?  ?Nonallopathic problems ? ?Decision today to treat with OMT was based on Physical Exam ? ?After verbal consent patient was treated with HVLA, ME, FPR techniques in cervical, rib, thoracic, lumbar, and sacral  areas ? ?Patient tolerated the procedure well with improvement in symptoms ? ?Patient given exercises, stretches and lifestyle modifications ? ?See medications in patient instructions if given ? ?Patient will follow up in 4-8 weeks ? ?  ? ? ?The above documentation has been reviewed and is accurate and complete [name], DO ? ? ? ?  ? ? Note: This dictation was prepared with Dragon dictation along with smaller phrase technology. Any transcriptional errors that result from this process are unintentional.    ?  ?  ? ?

## 2021-05-09 ENCOUNTER — Other Ambulatory Visit: Payer: Self-pay

## 2021-05-09 ENCOUNTER — Ambulatory Visit: Payer: Self-pay

## 2021-05-09 ENCOUNTER — Ambulatory Visit (INDEPENDENT_AMBULATORY_CARE_PROVIDER_SITE_OTHER): Payer: BC Managed Care – PPO | Admitting: Family Medicine

## 2021-05-09 VITALS — BP 124/76 | HR 77 | Ht 73.0 in | Wt 251.0 lb

## 2021-05-09 DIAGNOSIS — M9908 Segmental and somatic dysfunction of rib cage: Secondary | ICD-10-CM

## 2021-05-09 DIAGNOSIS — G5701 Lesion of sciatic nerve, right lower limb: Secondary | ICD-10-CM

## 2021-05-09 DIAGNOSIS — M9903 Segmental and somatic dysfunction of lumbar region: Secondary | ICD-10-CM

## 2021-05-09 DIAGNOSIS — M9902 Segmental and somatic dysfunction of thoracic region: Secondary | ICD-10-CM | POA: Diagnosis not present

## 2021-05-09 DIAGNOSIS — G5782 Other specified mononeuropathies of left lower limb: Secondary | ICD-10-CM | POA: Diagnosis not present

## 2021-05-09 DIAGNOSIS — M9904 Segmental and somatic dysfunction of sacral region: Secondary | ICD-10-CM | POA: Diagnosis not present

## 2021-05-09 DIAGNOSIS — M9901 Segmental and somatic dysfunction of cervical region: Secondary | ICD-10-CM | POA: Diagnosis not present

## 2021-05-09 NOTE — Patient Instructions (Addendum)
Injections today ?Continue gabapentin ?[employer] ?[place] recovery sandals in house ?Don't lace last eye of shoes ?Do prescribed exercises at least 3x a week ?See you again in 2 months ?

## 2021-05-11 DIAGNOSIS — G5782 Other specified mononeuropathies of left lower limb: Secondary | ICD-10-CM | POA: Insufficient documentation

## 2021-05-11 NOTE — Assessment & Plan Note (Signed)
Patient given injection and tolerated the procedure well this was of the right foot.  Discussed which activities to do which ones to avoid, over-the-counter orthotics, follow-up again 6 weeks ?

## 2021-05-11 NOTE — Assessment & Plan Note (Signed)
Repeat injection given again today.  Tolerated the procedure well.  Still states that he gets the most relief from this more than the epidurals in his back.  We will consider that as well though.  Chronic problem with exacerbation.  Follow-up again in 6 to 8 weeks.  Medication refills per orders ?

## 2021-06-28 NOTE — Progress Notes (Signed)
?Charlann Boxer D.O. ?[facility] ?[place] ?Phone: [phone] ?Subjective:   ?I, Kyle [name], am serving as a scribe for Dr. Hulan Saas. ? ?This visit occurred during the SARS-CoV-2 public health emergency.  Safety protocols were in place, including screening questions prior to the visit, additional usage of staff PPE, and extensive cleaning of exam room while observing appropriate contact time as indicated for disinfecting solutions.  ? ? ?I'm seeing this patient by the request  of:  [name], MD ? ?CC: Low back pain, right piriformis neck pain follow-up ? ?MEQ:[id number]  ?05/09/2021 ?Patient given injection and tolerated the procedure well this was of the right foot.  Discussed which activities to do which ones to avoid, over-the-counter orthotics, follow-up again 6 weeks ? ?Repeat injection given again today.  Tolerated the procedure well.  Still states that he gets the most relief from this more than the epidurals in his back.  We will consider that as well though.  Chronic problem with exacerbation.  Follow-up again in 6 to 8 weeks.  Medication refills per orders ? ?Update 07/01/2021 ?ESAI STECKLEIN is a 51 y.o. male coming in with complaint of LBP, R piriformis pain and L foot pain.  Given injection in the neuroma which did help. Continues to have some pain though when sitting.  ? ?No change in back and hip pain.  ? ? ?  ? ?Past Medical History:  ?Diagnosis Date  ? Allergy   ? Arthritis   ? Asthma   ? Cataract   ? small right eye   ? Constipation   ? Diverticulosis   ? Family history of colonic polyps   ? dad and sister   ? GERD (gastroesophageal reflux disease)   ? Neuromuscular disorder ([address])   ? spine  ? 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 ?Past Surgical History:  ?Procedure Laterality Date  ? ANTERIOR CRUCIATE LIGAMENT REPAIR    ? COLONOSCOPY    ? EXTERNAL EAR SURGERY    ? KNEE ARTHROSCOPY    ? lasik    ? ?Social History  ? ?Socioeconomic History   ? Marital status: Single  ?  Spouse name: Not on file  ? Number of children: Not on file  ? Years of education: Not on file  ? Highest education level: Not on file  ?Occupational History  ? Not on file  ?Tobacco Use  ? Smoking status: Former  ? Smokeless tobacco: Never  ?Substance and Sexual Activity  ? Alcohol use: Yes  ?  Comment: socially   ? Drug use: Never  ? Sexual activity: Not on file  ?Other Topics Concern  ? Not on file  ?Social History Narrative  ? Not on file  ? ?Social Determinants of Health  ? ?Financial Resource Strain: Not on file  ?Food Insecurity: Not on file  ?Transportation Needs: Not on file  ?Physical Activity: Not on file  ?Stress: Not on file  ?Social Connections: Not on file  ? ?Allergies  ?Allergen Reactions  ? Azithromycin Other (See Comments)  ?  Possible elevated LFTs ?Elevated liver enzymes ?Possible elevated LFTs ?  ? Cyclosporine Hives  ? Meperidine Hives  ? Penicillins Swelling  ?  Neck swelling ?  ? ?Family History  ?Problem Relation Age of Onset  ? Colon polyps Father   ? Colon polyps Sister   ? Colon cancer Paternal Aunt   ? Colon cancer Cousin   ? Heart  disease Neg Hx   ? Rectal cancer Neg Hx   ? Stomach cancer Neg Hx   ? ? ?Current Outpatient Medications (Endocrine & Metabolic):  ?  methylPREDNISolone (MEDROL DOSEPAK) 4 MG TBPK tablet, Taper as per dose pack ? ? ?Current Outpatient Medications (Respiratory):  ?  albuterol (VENTOLIN HFA) 108 (90 Base) MCG/ACT inhaler, Inhale into the lungs. ?  cetirizine (ZYRTEC) 10 MG tablet, Take by mouth. ? ?Current Outpatient Medications (Analgesics):  ?  colchicine 0.6 MG tablet, Take 1 tablet (0.6 mg total) by mouth daily. ?  HYDROcodone-acetaminophen (NORCO/VICODIN) 5-325 MG tablet, Take 1 tablet by mouth every 8 (eight) hours as needed for moderate pain. ? ?Current Outpatient Medications (Hematological):  ?  Cyanocobalamin (VITAMIN B 12 PO), Inject as directed. Every 10 days ? ?Current Outpatient Medications (Other):  ?  ciprofloxacin  (CIPRO) 500 MG tablet, Take 1 tablet (500 mg total) by mouth 2 (two) times daily. ?  CRANBERRY PO, Take by mouth. ?  cyclobenzaprine (FLEXERIL) 5 MG tablet, Take 1 tablet (5 mg total) by mouth 2 (two) times daily as needed for muscle spasms. ?  dexlansoprazole (DEXILANT) 60 MG capsule, Take 1 capsule (60 mg total) by mouth daily. ?  dicyclomine (BENTYL) 10 MG capsule, Take 2 capsules (20 mg total) by mouth 4 (four) times daily as needed for spasms. ?  gabapentin (NEURONTIN) 100 MG capsule, Take 2 capsules (200 mg total) by mouth 2 (two) times daily. ?  Magnesium Oxide 400 MG CAPS, Take by mouth. ?  Omega-3 1000 MG CAPS, Take by mouth. ?  ondansetron (ZOFRAN) 4 MG tablet, Take 1 tablet (4 mg total) by mouth every 8 (eight) hours as needed for nausea or vomiting. ?  pantoprazole (PROTONIX) 40 MG tablet, Take 1 tablet (40 mg total) by mouth daily. Take at least 30 minutes before 1st meal of the day. ?  phentermine 37.5 MG capsule, Take by mouth. ?  sucralfate (CARAFATE) 1 GM/10ML suspension, Take 10 mLs (1 g total) by mouth 4 (four) times daily -  with meals and at bedtime. ?  UNABLE TO FIND, daily. Med Name: Beet Root ?  UNABLE TO FIND, daily. Med Name: Miralax OTC ?  valACYclovir (VALTREX) 1000 MG tablet, Take 1 tablet (1,000 mg total) by mouth 2 (two) times daily. ? ? ?Reviewed prior external information including notes and imaging from  ?primary care provider ?As well as notes that were available from care everywhere and other healthcare systems. ? ?Past medical history, social, surgical and family history all reviewed in electronic medical record.  No pertanent information unless stated regarding to the chief complaint.  ? ?Review of Systems: ? No headache, visual changes, nausea, vomiting, diarrhea, constipation, dizziness, abdominal pain, skin rash, fevers, chills, night sweats, weight loss, swollen lymph nodes, body aches, joint swelling, chest pain, shortness of breath, mood changes. POSITIVE muscle  aches ? ?Objective  ?Blood pressure 120/82, pulse 84, height '6\' 1"'$  (1.854 m), weight 255 lb (115.7 kg), SpO2 98 %. ?  ?General: No apparent distress alert and oriented x3 mood and affect normal, dressed appropriately.  ?HEENT: Pupils equal, extraocular movements intact  ?Respiratory: Patient's speak in full sentences and does not appear short of breath  ?Cardiovascular: No lower extremity edema, non tender, no erythema  ?Gait normal with good balance and coordination.  ?MSK: Back exam does have some mild loss of lordosis.  Still has tightness noted in the paraspinal musculature on the right side.  Patient is also had neck discomfort and  loss of lordosis.  Patient does have tightness noted with sidebending bilaterally. ?Foot exam shows the patient does have some loss of lordosis in the transverse breakdown. ? ?Limited muscular skeletal ultrasound was performed and interpreted by Hulan Saas, M  ?Limited ultrasound of patient's right still shows the patient has some very mild dilatation of noted of the nerve of her foot.  Positive enlargement of one of the muscles in the area but does not seem to be impingement of the neuroma. ?Impression: Interval improvement ? ?Osteopathic findings ?C2 flexed rotated and side bent right ?C4 flexed rotated and side bent left ?T3 extended rotated and side bent right inhaled third rib ?L2 flexed rotated and side bent right ?L5 flexed rotated and side bent left ?Sacrum right on right ? ? ?  ?Impression and Recommendations:  ?  ? ?The above documentation has been reviewed and is accurate and complete [name], DO ? ? ? ?

## 2021-07-02 ENCOUNTER — Ambulatory Visit: Payer: Self-pay

## 2021-07-02 ENCOUNTER — Ambulatory Visit (INDEPENDENT_AMBULATORY_CARE_PROVIDER_SITE_OTHER): Payer: BC Managed Care – PPO | Admitting: Family Medicine

## 2021-07-02 VITALS — BP 120/82 | HR 84 | Ht 73.0 in | Wt 255.0 lb

## 2021-07-02 DIAGNOSIS — M9908 Segmental and somatic dysfunction of rib cage: Secondary | ICD-10-CM

## 2021-07-02 DIAGNOSIS — M9902 Segmental and somatic dysfunction of thoracic region: Secondary | ICD-10-CM

## 2021-07-02 DIAGNOSIS — M79671 Pain in right foot: Secondary | ICD-10-CM | POA: Diagnosis not present

## 2021-07-02 DIAGNOSIS — M9901 Segmental and somatic dysfunction of cervical region: Secondary | ICD-10-CM

## 2021-07-02 DIAGNOSIS — M9904 Segmental and somatic dysfunction of sacral region: Secondary | ICD-10-CM

## 2021-07-02 DIAGNOSIS — M9903 Segmental and somatic dysfunction of lumbar region: Secondary | ICD-10-CM | POA: Diagnosis not present

## 2021-07-02 DIAGNOSIS — M5416 Radiculopathy, lumbar region: Secondary | ICD-10-CM

## 2021-07-02 MED ORDER — GABAPENTIN 100 MG PO CAPS: 200.0000 mg | ORAL_CAPSULE | Freq: Two times a day (BID) | ORAL | 0 refills | Status: DC

## 2021-07-02 NOTE — Patient Instructions (Addendum)
?  Write me if you want injection ?See me again in 2 months ?

## 2021-07-02 NOTE — Assessment & Plan Note (Signed)
Chronic problem with mild exacerbation.  Discussed with patient about icing regimen and home exercise, which activities to do which ones to avoid, increase activity slowly.  Discussed icing regimen and home exercises.  Patient does have the gabapentin and increase the dosing to 200 mg twice a day.  Follow-up with me again 6 to 8 weeks.  Worsening pain consider the further epidural. ?

## 2021-08-14 NOTE — Progress Notes (Deleted)
  [place] [address] [place] Phone: [phone] Subjective:    I'm seeing this patient by the request  of:  [name], MD  CC: back and neck pain   [id number]  Kyle [name] is a 51 y.o. male coming in with complaint of back and neck pain. OMT 07/02/2021. Last visit did show improvement of neuroma of the foot. Patient states   Medications patient has been prescribed: Gabapentin  Taking:      Reviewed prior external information including notes and imaging from previsou exam, outside providers and external EMR if available.   As well as notes that were available from care everywhere and other healthcare systems.  Past medical history, social, surgical and family history all reviewed in electronic medical record.  No pertanent information unless stated regarding to the chief complaint.   Past Medical History:  Diagnosis Date   Allergy    Arthritis    Asthma    Cataract    small right eye    Constipation    Diverticulosis    Family history of colonic polyps    dad and sister    GERD (gastroesophageal reflux disease)    Neuromuscular disorder ([facility])    spine   Neuropathy of foot    right and mild    Allergies  Allergen Reactions   Azithromycin Other (See Comments)    Possible elevated LFTs Elevated liver enzymes Possible elevated LFTs    Cyclosporine Hives   Meperidine Hives   Penicillins Swelling    Neck swelling      Review of Systems:  No headache, visual changes, nausea, vomiting, diarrhea, constipation, dizziness, abdominal pain, skin rash, fevers, chills, night sweats, weight loss, swollen lymph nodes, body aches, joint swelling, chest pain, shortness of breath, mood changes. POSITIVE muscle aches  Objective  There were no vitals taken for this visit.   General: No apparent distress alert and oriented x3 mood and affect normal, dressed appropriately.  HEENT: Pupils equal, extraocular  movements intact  Respiratory: Patient's speak in full sentences and does not appear short of breath  Cardiovascular: No lower extremity edema, non tender, no erythema    Osteopathic findings  C2 flexed rotated and side bent right C6 flexed rotated and side bent left T3 extended rotated and side bent right inhaled rib T9 extended rotated and side bent left L2 flexed rotated and side bent right Sacrum right on right     Assessment and Plan:  No problem-specific Assessment & Plan notes found for this encounter.    Nonallopathic problems  Decision today to treat with OMT was based on Physical Exam  After verbal consent patient was treated with HVLA, ME, FPR techniques in cervical, rib, thoracic, lumbar, and sacral  areas  Patient tolerated the procedure well with improvement in symptoms  Patient given exercises, stretches and lifestyle modifications  See medications in patient instructions if given  Patient will follow up in 4-8 weeks      The above documentation has been reviewed and is accurate and complete [name], DO        Note: This dictation was prepared with Dragon dictation along with smaller phrase technology. Any transcriptional errors that result from this process are unintentional.

## 2021-08-20 ENCOUNTER — Ambulatory Visit (INDEPENDENT_AMBULATORY_CARE_PROVIDER_SITE_OTHER): Payer: BC Managed Care – PPO

## 2021-08-20 ENCOUNTER — Ambulatory Visit: Payer: BC Managed Care – PPO | Admitting: Family Medicine

## 2021-08-20 ENCOUNTER — Ambulatory Visit: Payer: Self-pay

## 2021-08-20 ENCOUNTER — Ambulatory Visit (INDEPENDENT_AMBULATORY_CARE_PROVIDER_SITE_OTHER): Payer: BC Managed Care – PPO | Admitting: Family Medicine

## 2021-08-20 VITALS — BP 116/82 | HR 63 | Ht 73.0 in | Wt 255.0 lb

## 2021-08-20 DIAGNOSIS — M25562 Pain in left knee: Secondary | ICD-10-CM

## 2021-08-20 DIAGNOSIS — G5782 Other specified mononeuropathies of left lower limb: Secondary | ICD-10-CM | POA: Diagnosis not present

## 2021-08-20 DIAGNOSIS — G5783 Other specified mononeuropathies of bilateral lower limbs: Secondary | ICD-10-CM | POA: Diagnosis not present

## 2021-08-20 DIAGNOSIS — M9903 Segmental and somatic dysfunction of lumbar region: Secondary | ICD-10-CM

## 2021-08-20 DIAGNOSIS — G8929 Other chronic pain: Secondary | ICD-10-CM

## 2021-08-20 DIAGNOSIS — M25561 Pain in right knee: Secondary | ICD-10-CM

## 2021-08-20 DIAGNOSIS — M9902 Segmental and somatic dysfunction of thoracic region: Secondary | ICD-10-CM

## 2021-08-20 DIAGNOSIS — M9904 Segmental and somatic dysfunction of sacral region: Secondary | ICD-10-CM

## 2021-08-20 DIAGNOSIS — G5781 Other specified mononeuropathies of right lower limb: Secondary | ICD-10-CM

## 2021-08-20 DIAGNOSIS — M5416 Radiculopathy, lumbar region: Secondary | ICD-10-CM

## 2021-08-20 MED ORDER — FLUCONAZOLE 200 MG PO TABS: 200.0000 mg | ORAL_TABLET | Freq: Every day | ORAL | 0 refills | Status: DC

## 2021-08-20 NOTE — Patient Instructions (Addendum)
Xrays today Injections in knee and foot Prescription filled See you again in 7-8 weeks

## 2021-08-20 NOTE — Assessment & Plan Note (Signed)
Chronic with worsening symptoms.  Seems to be more patellofemoral.  Given bilateral injections.  Tolerated the procedure well.  Discussed which activities to do and which ones to avoid.  Increase activity slowly.  Follow-up again in 6 to 8 weeks

## 2021-08-20 NOTE — Assessment & Plan Note (Signed)
Injection given and tolerated the procedure well.  This was the contralateral foot to the 1 week done previously.  Discussed icing regimen and home exercise, which activities to do which ones to avoid, increase activity slowly.  Follow-up again in 6 to 8 weeks

## 2021-08-20 NOTE — Progress Notes (Signed)
Kyle [name] [address] [place] Phone: [phone] Subjective:   Kyle [name], am serving as a scribe for Dr. Hulan Saas.   I'm seeing this patient by the request  of:  [name], MD  CC: Bilateral knee, right foot pain, back pain  [id number]  07/02/2021 Chronic problem with mild exacerbation.  Discussed with patient about icing regimen and home exercise, which activities to do which ones to avoid, increase activity slowly.  Discussed icing regimen and home exercises.  Patient does have the gabapentin and increase the dosing to 200 mg twice a day.  Follow-up with me again 6 to 8 weeks.  Worsening pain consider the further epidural.  Updated 08/20/2021 Kyle [name] is a 51 y.o. male coming in with complaint of back and leg pain. Not doing well. Knees and calves in pain. Back is doing okay. Has gotten worse. Swelling is noted. Patient states that the knees cause him more discomfort and pain at the moment.  States that his foot does go numb after a lot of walking.  Patient denies any true swelling of the lower extremities.  Patient states that both of these pains will cause enough pain that does stop certain activities.      Past Medical History:  Diagnosis Date   Allergy    Arthritis    Asthma    Cataract    small right eye    Constipation    Diverticulosis    Family history of colonic polyps    dad and sister    GERD (gastroesophageal reflux disease)    Neuromuscular disorder ([facility])    spine   Neuropathy of foot    right and mild   Past Surgical History:  Procedure Laterality Date   ANTERIOR CRUCIATE LIGAMENT REPAIR     COLONOSCOPY     EXTERNAL EAR SURGERY     KNEE ARTHROSCOPY     lasik     Social History   Socioeconomic History   Marital status: Single    Spouse name: Not on file   Number of children: Not on file   Years of education: Not on file   Highest education level: Not on file   Occupational History   Not on file  Tobacco Use   Smoking status: Former   Smokeless tobacco: Never  Substance and Sexual Activity   Alcohol use: Yes    Comment: socially    Drug use: Never   Sexual activity: Not on file  Other Topics Concern   Not on file  Social History Narrative   Not on file   Social Determinants of Health   Financial Resource Strain: Not on file  Food Insecurity: Not on file  Transportation Needs: Not on file  Physical Activity: Not on file  Stress: Not on file  Social Connections: Not on file   Allergies  Allergen Reactions   Azithromycin Other (See Comments)    Possible elevated LFTs Elevated liver enzymes Possible elevated LFTs    Cyclosporine Hives   Meperidine Hives   Penicillins Swelling    Neck swelling    Family History  Problem Relation Age of Onset   Colon polyps Father    Colon polyps Sister    Colon cancer Paternal Aunt    Colon cancer Cousin    Heart disease Neg Hx    Rectal cancer Neg Hx    Stomach cancer Neg Hx     Current Outpatient Medications (Endocrine &  Metabolic):    methylPREDNISolone (MEDROL DOSEPAK) 4 MG TBPK tablet, Taper as per dose pack   Current Outpatient Medications (Respiratory):    albuterol (VENTOLIN HFA) 108 (90 Base) MCG/ACT inhaler, Inhale into the lungs.   cetirizine (ZYRTEC) 10 MG tablet, Take by mouth.  Current Outpatient Medications (Analgesics):    colchicine 0.6 MG tablet, Take 1 tablet (0.6 mg total) by mouth daily.   HYDROcodone-acetaminophen (NORCO/VICODIN) 5-325 MG tablet, Take 1 tablet by mouth every 8 (eight) hours as needed for moderate pain.  Current Outpatient Medications (Hematological):    Cyanocobalamin (VITAMIN B 12 PO), Inject as directed. Every 10 days  Current Outpatient Medications (Other):    fluconazole (DIFLUCAN) 200 MG tablet, Take 1 tablet (200 mg total) by mouth daily.   ciprofloxacin (CIPRO) 500 MG tablet, Take 1 tablet (500 mg total) by mouth 2 (two) times  daily.   CRANBERRY PO, Take by mouth.   cyclobenzaprine (FLEXERIL) 5 MG tablet, Take 1 tablet (5 mg total) by mouth 2 (two) times daily as needed for muscle spasms.   dexlansoprazole (DEXILANT) 60 MG capsule, Take 1 capsule (60 mg total) by mouth daily.   dicyclomine (BENTYL) 10 MG capsule, Take 2 capsules (20 mg total) by mouth 4 (four) times daily as needed for spasms.   gabapentin (NEURONTIN) 100 MG capsule, Take 2 capsules (200 mg total) by mouth 2 (two) times daily.   Magnesium Oxide 400 MG CAPS, Take by mouth.   Omega-3 1000 MG CAPS, Take by mouth.   ondansetron (ZOFRAN) 4 MG tablet, Take 1 tablet (4 mg total) by mouth every 8 (eight) hours as needed for nausea or vomiting.   pantoprazole (PROTONIX) 40 MG tablet, Take 1 tablet (40 mg total) by mouth daily. Take at least 30 minutes before 1st meal of the day.   phentermine 37.5 MG capsule, Take by mouth.   sucralfate (CARAFATE) 1 GM/10ML suspension, Take 10 mLs (1 g total) by mouth 4 (four) times daily -  with meals and at bedtime.   UNABLE TO FIND, daily. Med Name: Beet Root   UNABLE TO FIND, daily. Med Name: Miralax OTC   valACYclovir (VALTREX) 1000 MG tablet, Take 1 tablet (1,000 mg total) by mouth 2 (two) times daily.   Reviewed prior external information including notes and imaging from  primary care provider As well as notes that were available from care everywhere and other healthcare systems.  Past medical history, social, surgical and family history all reviewed in electronic medical record.  No pertanent information unless stated regarding to the chief complaint.   Review of Systems:  No headache, visual changes, nausea, vomiting, diarrhea, constipation, dizziness, abdominal pain, skin rash, fevers, chills, night sweats, weight loss, swollen lymph nodes, body aches, joint swelling, chest pain, shortness of breath, mood changes. POSITIVE muscle aches  Objective  Blood pressure 116/82, pulse 63, height '6\' 1"'$  (1.854 m), weight  255 lb (115.7 kg), SpO2 96 %.   General: No apparent distress alert and oriented x3 mood and affect normal, dressed appropriately.  HEENT: Pupils equal, extraocular movements intact  Respiratory: Patient's speak in full sentences and does not appear short of breath  Cardiovascular: No lower extremity edema, non tender, no erythema  Bilateral knees do have a trace effusion noted.  Seems to be worse left greater than right.  Patient does have some limited range of motion.  Postsurgical changes noted on the left side. Right foot exam does have breakdown of the transverse arch noted.  Positive squeeze test  noted.  Tenderness in the third interspace of the right foot  Low back exam does have some loss of lordosis.  Some tenderness to palpation in the paraspinal musculature.  Tightness with FABER test to the left greater than right  Osteopathic findings  T3 extended rotated and side bent right inhaled third rib T9 extended rotated and side bent left L2 flexed rotated and side bent right Sacrum left on left   Procedure: Real-time Ultrasound Guided Injection of right foot neuroma Device: GE Logiq Q7 Ultrasound guided injection is preferred based studies that show increased duration, increased effect, greater accuracy, decreased procedural pain, increased response rate, and decreased cost with ultrasound guided versus blind injection.  Verbal informed consent obtained.  Time-out conducted.  Noted no overlying erythema, induration, or other signs of local infection.  Skin prepped in a sterile fashion.  Local anesthesia: Topical Ethyl chloride.  With sterile technique and under real time ultrasound guidance: With a 25-gauge half inch needle injecting 0.5 cc of 0.5% Marcaine and 0.5 cc of Kenalog 40 mg/mL. Completed without difficulty  Pain immediately improved suggesting accurate placement of the medication.  Advised to call if fevers/chills, erythema, induration, drainage, or persistent bleeding.   Impression: Technically successful ultrasound guided injection.  After informed written and verbal consent, patient was seated on exam table. Right knee was prepped with alcohol swab and utilizing anterolateral approach, patient's right knee space was injected with 4:1  marcaine 0.5%: Kenalog '40mg'$ /dL. Patient tolerated the procedure well without immediate complications.  After informed written and verbal consent, patient was seated on exam table. Left knee was prepped with alcohol swab and utilizing anterolateral approach, patient's left knee space was injected with 4:1  marcaine 0.5%: Kenalog '40mg'$ /dL. Patient tolerated the procedure well without immediate complications.    Impression and Recommendations:

## 2021-08-20 NOTE — Assessment & Plan Note (Signed)
Chronic, with mild exacerbation.  Discussed icing regimen and home exercises with him.  Responding relatively well to osteopathic manipulation.  Has had different medications over the course of time including the Flexeril and gabapentin.  Encourage patient to take this for any exacerbations or worsening symptoms.  Follow-up with me again in 6 to 8 weeks.

## 2021-09-03 ENCOUNTER — Ambulatory Visit: Payer: BC Managed Care – PPO | Admitting: Family Medicine

## 2021-09-17 ENCOUNTER — Other Ambulatory Visit: Payer: Self-pay | Admitting: Internal Medicine

## 2021-09-17 DIAGNOSIS — R1013 Epigastric pain: Secondary | ICD-10-CM

## 2021-10-08 ENCOUNTER — Ambulatory Visit: Payer: BC Managed Care – PPO | Admitting: Family Medicine

## 2021-10-21 ENCOUNTER — Other Ambulatory Visit: Payer: Self-pay | Admitting: Internal Medicine

## 2021-10-21 DIAGNOSIS — K582 Mixed irritable bowel syndrome: Secondary | ICD-10-CM

## 2021-10-21 DIAGNOSIS — R1013 Epigastric pain: Secondary | ICD-10-CM

## 2021-10-21 MED ORDER — DICYCLOMINE HCL 10 MG PO CAPS: 20.0000 mg | ORAL_CAPSULE | Freq: Four times a day (QID) | ORAL | 3 refills | Status: DC | PRN

## 2021-10-21 NOTE — Progress Notes (Unsigned)
[place] Phone: [phone] Subjective:   [name], am serving as a scribe for Dr. Hulan Saas.  I'm seeing this patient by the request  of:  [name], MD  CC: Back and neck pain follow-up  [id number]  BYRD RUSHLOW is a 51 y.o. male coming in with complaint of back and neck pain. OMT 08/20/2021. Also f/u for R foot and B knee pain. Injections given in knees and foot last visit. Patient states that his foot is improving but still isnt 100%.   Back pain is the same as last visit.   Medications patient has been prescribed: Gabapentin, Diflucan  Taking:         Reviewed prior external information including notes and imaging from previsou exam, outside providers and external EMR if available.   As well as notes that were available from care everywhere and other healthcare systems.  Past medical history, social, surgical and family history all reviewed in electronic medical record.  No pertanent information unless stated regarding to the chief complaint.   Past Medical History:  Diagnosis Date   Allergy    Arthritis    Asthma    Cataract    small right eye    Constipation    Diverticulosis    Family history of colonic polyps    dad and sister    GERD (gastroesophageal reflux disease)    Neuromuscular disorder (HCC)    spine   Neuropathy of foot    right and mild    Allergies  Allergen Reactions   Azithromycin Other (See Comments)    Possible elevated LFTs Elevated liver enzymes Possible elevated LFTs    Cyclosporine Hives   Meperidine Hives   Penicillins Swelling    Neck swelling      Review of Systems:  No headache, visual changes, nausea, vomiting, diarrhea, constipation, dizziness, abdominal pain, skin rash, fevers, chills, night sweats, weight loss, swollen lymph nodes, body aches, joint swelling, chest pain, shortness of breath, mood changes. POSITIVE muscle  aches  Objective  Blood pressure (!) 122/92, pulse (!) 58, height '6\' 1"'$  (1.854 m), weight 255 lb (115.7 kg), SpO2 99 %.   General: No apparent distress alert and oriented x3 mood and affect normal, dressed appropriately.  HEENT: Pupils equal, extraocular movements intact  Respiratory: Patient's speak in full sentences and does not appear short of breath  Cardiovascular: No lower extremity edema, non tender, no erythema  Gait MSK:  Back low back exam does have a positive straight leg on the right side.  Movement causes radicular symptoms in the L5 and S1 distribution.  Patient also has tightness of the hips bilaterally right greater than left.  5 out of 5 strength.  Tightness noted in the paraspinal musculature.  Neck exam does have some limited sidebending bilaterally.  Osteopathic findings  C2 flexed rotated and side bent right C5 flexed rotated and side bent left T3 extended rotated and side bent right inhaled rib T9 extended rotated and side bent left L2 flexed rotated and side bent right Sacrum right on right     Assessment and Plan:  Lumbar radiculopathy Worsening low back pain with some radicular symptoms again.  Patient has responded extremely well to a epidural previously.  Hopefully patient will again.  Attempted osteopathic manipulation today as well.  Medications including cyclobenzaprine and gabapentin we have done previously.  Follow-up with me again in 6 to 8 weeks otherwise.  Nonallopathic problems  Decision today to treat with OMT was based on Physical Exam  After verbal consent patient was treated with HVLA, ME, FPR techniques in cervical, rib, thoracic, lumbar, and sacral  areas  Patient tolerated the procedure well with improvement in symptoms  Patient given exercises, stretches and lifestyle modifications  See medications in patient instructions if given  Patient will follow up in 4-8 weeks     The above documentation has been reviewed and is  accurate and complete [name], DO         Note: This dictation was prepared with Dragon dictation along with smaller phrase technology. Any transcriptional errors that result from this process are unintentional.

## 2021-10-21 NOTE — Progress Notes (Signed)
Ibs refill

## 2021-10-22 ENCOUNTER — Ambulatory Visit: Payer: Self-pay

## 2021-10-22 ENCOUNTER — Ambulatory Visit (INDEPENDENT_AMBULATORY_CARE_PROVIDER_SITE_OTHER): Payer: BC Managed Care – PPO | Admitting: Family Medicine

## 2021-10-22 VITALS — BP 122/92 | HR 58 | Ht 73.0 in | Wt 255.0 lb

## 2021-10-22 DIAGNOSIS — M79671 Pain in right foot: Secondary | ICD-10-CM

## 2021-10-22 DIAGNOSIS — M9903 Segmental and somatic dysfunction of lumbar region: Secondary | ICD-10-CM

## 2021-10-22 DIAGNOSIS — M9901 Segmental and somatic dysfunction of cervical region: Secondary | ICD-10-CM

## 2021-10-22 DIAGNOSIS — M9908 Segmental and somatic dysfunction of rib cage: Secondary | ICD-10-CM | POA: Diagnosis not present

## 2021-10-22 DIAGNOSIS — M9902 Segmental and somatic dysfunction of thoracic region: Secondary | ICD-10-CM

## 2021-10-22 DIAGNOSIS — M9904 Segmental and somatic dysfunction of sacral region: Secondary | ICD-10-CM | POA: Diagnosis not present

## 2021-10-22 DIAGNOSIS — M5416 Radiculopathy, lumbar region: Secondary | ICD-10-CM | POA: Diagnosis not present

## 2021-10-22 MED ORDER — PREDNISONE 50 MG PO TABS: ORAL_TABLET | ORAL | 0 refills | Status: DC

## 2021-10-22 NOTE — Assessment & Plan Note (Signed)
Worsening low back pain with some radicular symptoms again.  Patient has responded extremely well to a epidural previously.  Hopefully patient will again.  Attempted osteopathic manipulation today as well.  Medications including cyclobenzaprine and gabapentin we have done previously.  Follow-up with me again in 6 to 8 weeks otherwise.

## 2021-10-22 NOTE — Patient Instructions (Addendum)
$'50mg'S$  prednisone prescribed Read on gel injections Already has appointment  [place] [phone] Call Today to schedule Epidural

## 2021-12-02 NOTE — Progress Notes (Unsigned)
[place] Phone: [phone] Subjective:   Kyle Kyle [name], am serving as a scribe for Dr. Hulan Saas.   I'm seeing this patient by the request  of:  Kyle Pries, MD  CC: right leg pain   [id number]  Kyle Kyle [name] is a 51 y.o. male coming in with complaint of back and neck pain. OMT 10/22/2021. Patient states that his lower back pain has increased pain in R leg is worsening. Epidural scheduled for October 5th.   Medications patient has been prescribed: Gabapentin  Taking:         Reviewed prior external information including notes and imaging from previsou exam, outside providers and external EMR if available.   As well as notes that were available from care everywhere and other healthcare systems.  Past medical history, social, surgical and family history all reviewed in electronic medical record.  Kyle [name] pertanent information unless stated regarding to the chief complaint.   Past Medical History:  Diagnosis Date   Allergy    Arthritis    Asthma    Cataract    small right eye    Constipation    Diverticulosis    Family history of colonic polyps    dad and sister    GERD (gastroesophageal reflux disease)    Neuromuscular disorder (HCC)    spine   Neuropathy of foot    right and mild    Allergies  Allergen Reactions   Azithromycin Other (See Comments)    Possible elevated LFTs Elevated liver enzymes Possible elevated LFTs    Cyclosporine Hives   Meperidine Hives   Penicillins Swelling    Neck swelling      Review of Systems:  Kyle [name] headache, visual changes, nausea, vomiting, diarrhea, constipation, dizziness, abdominal pain, skin rash, fevers, chills, night sweats, weight loss, swollen lymph nodes, body aches, joint swelling, chest pain, shortness of breath, mood changes. POSITIVE muscle aches  Objective  Blood pressure 128/82, pulse 72, height '6\' 1"'$  (1.854 m), weight 255  lb (115.7 kg), SpO2 97 %.   General: Kyle [name] apparent distress alert and oriented x3 mood and affect normal, dressed appropriately.  HEENT: Pupils equal, extraocular movements intact  Respiratory: Patient's speak in full sentences and does not appear short of breath  Cardiovascular: Kyle [name] lower extremity edema, non tender, Kyle [name] erythema  Gait normal  MSK:  Back patient does have a straight leg positive on the right side.  Patient does have a negative straight leg test on the left side though.  Patient does have 4-5 strength with dorsiflexion on the right side.  Osteopathic findings  C2 flexed rotated and side bent right C6 flexed rotated and side bent left T3 extended rotated and side bent right inhaled rib T7 extended rotated and side bent left L2 flexed rotated and side bent right Sacrum right on right       Assessment and Plan:  Lumbar radiculopathy Worsening lumbar radiculopathy right now.  Patient does have a positive straight leg test.  Does have some asymmetric swelling of the lower extremity.  We will get a D-dimer today to rule out any type of blood clot but think it is highly unlikely.  Discussed with patient about icing regimen and home exercises otherwise.  Already scheduled for an epidural on October 5.  Follow-up again in 6 to 8 weeks.  Muscle spasm of right leg Questionable swelling in the leg spasm.  D-dimer ordered, ABI ordered to  further evaluate with family history of peripheral vascular disease.  Likely some lumbar radiculopathy playing a role.  Follow-up again in 6 to 8 weeks    Nonallopathic problems  Decision today to treat with OMT was based on Physical Exam  After verbal consent patient was treated with HVLA, ME, FPR techniques in cervical, rib, thoracic, lumbar, and sacral  areas  Patient tolerated the procedure well with improvement in symptoms  Patient given exercises, stretches and lifestyle modifications  See medications in patient instructions if  given  Patient will follow up in 4-8 weeks     The above documentation has been reviewed and is accurate and complete Kyle Pulley, DO         Note: This dictation was prepared with Dragon dictation along with smaller phrase technology. Any transcriptional errors that result from this process are unintentional.

## 2021-12-03 ENCOUNTER — Ambulatory Visit (INDEPENDENT_AMBULATORY_CARE_PROVIDER_SITE_OTHER): Payer: BC Managed Care – PPO | Admitting: Family Medicine

## 2021-12-03 VITALS — BP 128/82 | HR 72 | Ht 73.0 in | Wt 255.0 lb

## 2021-12-03 DIAGNOSIS — M25561 Pain in right knee: Secondary | ICD-10-CM

## 2021-12-03 DIAGNOSIS — M9904 Segmental and somatic dysfunction of sacral region: Secondary | ICD-10-CM

## 2021-12-03 DIAGNOSIS — M9901 Segmental and somatic dysfunction of cervical region: Secondary | ICD-10-CM | POA: Diagnosis not present

## 2021-12-03 DIAGNOSIS — M25562 Pain in left knee: Secondary | ICD-10-CM

## 2021-12-03 DIAGNOSIS — M9903 Segmental and somatic dysfunction of lumbar region: Secondary | ICD-10-CM

## 2021-12-03 DIAGNOSIS — M9908 Segmental and somatic dysfunction of rib cage: Secondary | ICD-10-CM

## 2021-12-03 DIAGNOSIS — M62838 Other muscle spasm: Secondary | ICD-10-CM | POA: Diagnosis not present

## 2021-12-03 DIAGNOSIS — M9902 Segmental and somatic dysfunction of thoracic region: Secondary | ICD-10-CM

## 2021-12-03 DIAGNOSIS — M5416 Radiculopathy, lumbar region: Secondary | ICD-10-CM | POA: Diagnosis not present

## 2021-12-03 MED ORDER — METHYLPREDNISOLONE ACETATE 80 MG/ML IJ SUSP
80.0000 mg | Freq: Once | INTRAMUSCULAR | Status: AC
  Administered 2021-12-03: 80 mg via INTRAMUSCULAR

## 2021-12-03 MED ORDER — KETOROLAC TROMETHAMINE 60 MG/2ML IM SOLN
60.0000 mg | Freq: Once | INTRAMUSCULAR | Status: AC
  Administered 2021-12-03: 60 mg via INTRAMUSCULAR

## 2021-12-03 NOTE — Patient Instructions (Addendum)
Injections in backside D dimer today Get epidural  ABI test [facility]  ([facility] [employer] in [facility]) [address] [place], [place] [address] [phone] Oct 9th 1:00pm, Arrive at 12:45pm to check in  See me again in 4-5 weeks after injection

## 2021-12-03 NOTE — Assessment & Plan Note (Signed)
Worsening lumbar radiculopathy right now.  Patient does have a positive straight leg test.  Does have some asymmetric swelling of the lower extremity.  We will get a D-dimer today to rule out any type of blood clot but think it is highly unlikely.  Discussed with patient about icing regimen and home exercises otherwise.  Already scheduled for an epidural on October 5.  Follow-up again in 6 to 8 weeks.

## 2021-12-03 NOTE — Assessment & Plan Note (Signed)
Questionable swelling in the leg spasm.  D-dimer ordered, ABI ordered to further evaluate with family history of peripheral vascular disease.  Likely some lumbar radiculopathy playing a role.  Follow-up again in 6 to 8 weeks

## 2021-12-04 ENCOUNTER — Encounter: Payer: Self-pay | Admitting: Family Medicine

## 2021-12-04 LAB — D-DIMER, QUANTITATIVE: D-Dimer, Quant: 0.33 mcg/mL FEU (ref <0.50)

## 2021-12-04 MED ORDER — LEVOFLOXACIN 500 MG PO TABS: 500.0000 mg | ORAL_TABLET | Freq: Every day | ORAL | 0 refills | Status: DC

## 2021-12-09 NOTE — Telephone Encounter (Signed)
Patient called stating that based on the D Dimer, he canceled the Vascular appointment.  He received a call to schedule for an arterial appointment.  He was not aware of that being ordered. He asked if this was something that he needed to do, or if based on the D Dimer results, he shouldn't have it done?  Please advise.  (He was also diagnosed with Covid on 9/30)

## 2021-12-12 ENCOUNTER — Other Ambulatory Visit: Payer: BC Managed Care – PPO

## 2021-12-13 ENCOUNTER — Other Ambulatory Visit: Payer: Self-pay | Admitting: Internal Medicine

## 2021-12-13 DIAGNOSIS — R1013 Epigastric pain: Secondary | ICD-10-CM

## 2021-12-16 ENCOUNTER — Ambulatory Visit (HOSPITAL_COMMUNITY): Payer: BC Managed Care – PPO

## 2021-12-20 ENCOUNTER — Ambulatory Visit
Admission: RE | Admit: 2021-12-20 | Discharge: 2021-12-20 | Disposition: A | Discharge status: Home / Self Care | Payer: BC Managed Care – PPO | Source: Ambulatory Visit | Attending: Family Medicine | Admitting: Family Medicine

## 2021-12-20 DIAGNOSIS — M5416 Radiculopathy, lumbar region: Secondary | ICD-10-CM

## 2021-12-20 MED ORDER — METHYLPREDNISOLONE ACETATE 40 MG/ML INJ SUSP (RADIOLOG
80.0000 mg | Freq: Once | INTRAMUSCULAR | Status: AC
  Administered 2021-12-20: 80 mg via EPIDURAL

## 2021-12-20 MED ORDER — IOPAMIDOL (ISOVUE-M 200) INJECTION 41%
1.0000 mL | Freq: Once | INTRAMUSCULAR | Status: AC
  Administered 2021-12-20: 1 mL via EPIDURAL

## 2021-12-20 NOTE — Discharge Instructions (Signed)

## 2022-01-08 NOTE — Progress Notes (Signed)
Kyle [name] Phone: [phone] Subjective:   Kyle [name], am serving as a scribe for Dr. Hulan Saas.  I'm seeing this patient by the request  of:  [name], MD  CC: Low back pain with radiation  [id number]  Kyle [name] is a 51 y.o. male coming in with complaint of back and neck pain. OMT 12/03/2021. Patient states that he is having numbness in R leg. Calf is achy and foot is number. Pain in glute as well. Epidural on 12/20/2021 was helpful but is wearing off.   Medications patient has been prescribed: Levaquin  Taking:         Reviewed prior external information including notes and imaging from previsou exam, outside providers and external EMR if available.   As well as notes that were available from care everywhere and other healthcare systems.  Past medical history, social, surgical and family history all reviewed in electronic medical record.  [name] pertanent information unless stated regarding to the chief complaint.   Past Medical History:  Diagnosis Date   Allergy    Arthritis    Asthma    Cataract    small right eye    Constipation    Diverticulosis    Family history of colonic polyps    dad and sister    GERD (gastroesophageal reflux disease)    Neuromuscular disorder (HCC)    spine   Neuropathy of foot    right and mild    Allergies  Allergen Reactions   Azithromycin Other (See Comments)    Possible elevated LFTs Elevated liver enzymes Possible elevated LFTs    Cyclosporine Hives   Meperidine Hives   Penicillins Swelling    Neck swelling      Review of Systems:  [name] headache, visual changes, nausea, vomiting, diarrhea, constipation, dizziness, abdominal pain, skin rash, fevers, chills, night sweats, weight loss, swollen lymph nodes, body aches, joint swelling, chest pain, shortness of breath, mood changes. POSITIVE muscle aches  Objective  Blood pressure  120/84, pulse (!) 103, height '6\' 1"'$  (1.854 m), weight 248 lb (112.5 kg), SpO2 96 %.   General: [name] apparent distress alert and oriented x3 mood and affect normal, dressed appropriately.  HEENT: Pupils equal, extraocular movements intact  Respiratory: Patient's speak in full sentences and does not appear short of breath  Cardiovascular: [name] lower extremity edema, non tender, [name] erythema  Gait MSK:  Back does have some loss of lordosis.  Patient does have positive straight leg test noted on the right side.  4-5 strength of dorsiflexion on the right  Osteopathic findings  T3 extended rotated and side bent right inhaled rib T9 extended rotated and side bent left L1 flexed rotated and side bent right Sacrum right on right       Assessment and Plan:  Lumbar radiculopathy Lumbar does have some loss of lordosis  More pain over the right side and radicular sympromtoms due to the weakness also with dorsiflexion we will start on prednisone.  Patient wants to hold on another epidural.  Still wants to avoid any surgical intervention.  Follow-up again in 6 to 8 weeks.    Nonallopathic problems  Decision today to treat with OMT was based on Physical Exam  After verbal consent patient was treated with HVLA, ME, FPR techniques in  thoracic, lumbar, and sacral  areas  Patient tolerated the procedure well with improvement in symptoms  Patient given exercises, stretches  and lifestyle modifications  See medications in patient instructions if given  Patient will follow up in 4-8 weeks    The above documentation has been reviewed and is accurate and complete [name], DO          Note: This dictation was prepared with Dragon dictation along with smaller phrase technology. Any transcriptional errors that result from this process are unintentional.

## 2022-01-14 ENCOUNTER — Encounter: Payer: Self-pay | Admitting: Family Medicine

## 2022-01-14 ENCOUNTER — Ambulatory Visit (INDEPENDENT_AMBULATORY_CARE_PROVIDER_SITE_OTHER): Payer: BC Managed Care – PPO | Admitting: Family Medicine

## 2022-01-14 VITALS — BP 120/84 | HR 103 | Ht 73.0 in | Wt 248.0 lb

## 2022-01-14 DIAGNOSIS — M9903 Segmental and somatic dysfunction of lumbar region: Secondary | ICD-10-CM | POA: Diagnosis not present

## 2022-01-14 DIAGNOSIS — M5416 Radiculopathy, lumbar region: Secondary | ICD-10-CM | POA: Diagnosis not present

## 2022-01-14 DIAGNOSIS — M9904 Segmental and somatic dysfunction of sacral region: Secondary | ICD-10-CM

## 2022-01-14 DIAGNOSIS — M9902 Segmental and somatic dysfunction of thoracic region: Secondary | ICD-10-CM | POA: Diagnosis not present

## 2022-01-14 MED ORDER — PREDNISONE 50 MG PO TABS: ORAL_TABLET | ORAL | 0 refills | Status: DC

## 2022-01-14 NOTE — Patient Instructions (Signed)
Prednisone 5 days See me in 7-8 weeks

## 2022-01-14 NOTE — Assessment & Plan Note (Addendum)
Lumbar does have some loss of lordosis  More pain over the right side and radicular sympromtoms due to the weakness also with dorsiflexion we will start on prednisone.  Patient wants to hold on another epidural.  Still wants to avoid any surgical intervention.  Follow-up again in 6 to 8 weeks.  Also refilled gabapentin 200 to 300 mg at night

## 2022-02-18 ENCOUNTER — Other Ambulatory Visit: Payer: Self-pay | Admitting: Internal Medicine

## 2022-02-18 DIAGNOSIS — J069 Acute upper respiratory infection, unspecified: Secondary | ICD-10-CM

## 2022-02-18 MED ORDER — LEVOFLOXACIN 500 MG PO TABS: 500.0000 mg | ORAL_TABLET | Freq: Every day | ORAL | 0 refills | Status: DC

## 2022-02-18 NOTE — Progress Notes (Signed)
Recurrent sinusitis No fevers Levaquin 500 milligrams daily x 7 days To PCP if not improving

## 2022-03-11 NOTE — Progress Notes (Signed)
[place] Phone: [phone] Subjective:   [name], am serving as a scribe for Dr. Hulan Saas.  I'm seeing this patient by the request  of:  [name], MD  CC: back and neck pain f/u   [id number]  Kyle [name] is a 52 y.o. male coming in with complaint of back and neck pain. OMT 01/14/2022. Patient states that he had drug reaction to Levoquin. Having polyarthralgia. Pred  Medications patient has been prescribed: None  Taking:         Reviewed prior external information including notes and imaging from previsou exam, outside providers and external EMR if available.   As well as notes that were available from care everywhere and other healthcare systems.  Past medical history, social, surgical and family history all reviewed in electronic medical record.  No pertanent information unless stated regarding to the chief complaint.   Past Medical History:  Diagnosis Date   Allergy    Arthritis    Asthma    Cataract    small right eye    Constipation    Diverticulosis    Family history of colonic polyps    dad and sister    GERD (gastroesophageal reflux disease)    Neuromuscular disorder (HCC)    spine   Neuropathy of foot    right and mild    Allergies  Allergen Reactions   Azithromycin Other (See Comments)    Possible elevated LFTs Elevated liver enzymes Possible elevated LFTs    Cyclosporine Hives   Meperidine Hives   Levaquin [Levofloxacin]     Muscle pains   Penicillins Swelling    Neck swelling      Review of Systems:  No headache, visual changes, nausea, vomiting, diarrhea, constipation, dizziness, abdominal pain, skin rash, fevers, chills, night sweats, weight loss, swollen lymph nodes, body aches, joint swelling, chest pain, shortness of breath, mood changes. POSITIVE muscle aches  Objective  Blood pressure 120/76, pulse 89, height '6\' 1"'$  (1.854 m),  weight 247 lb (112 kg), SpO2 97 %.   General: No apparent distress alert and oriented x3 mood and affect normal, dressed appropriately.  HEENT: Pupils equal, extraocular movements intact  Respiratory: Patient's speak in full sentences and does not appear short of breath  Cardiovascular: No lower extremity edema, non tender, no erythema  Patient does have some swelling of the knees bilaterally.  Does have some tenderness to palpation all musculature.  Tightness with straight leg test.  Tenderness to palpation in the paraspinal musculature in the lumbar spine.  Osteopathic findings Cervical C2 flexed rotated and side bent right C4 flexed rotated and side bent left C6 flexed rotated and side bent left T3 extended rotated and side bent right inhaled third rib T9 extended rotated and side bent left L2 flexed rotated and side bent right Sacrum right on right   Osteopathic findings  C2 flexed rotated and side bent right C6 flexed rotated and side bent left T3 extended rotated and side bent right inhaled rib T9 extended rotated and side bent left L2 flexed rotated and side bent right Sacrum right on right       Assessment and Plan:  Muscle spasm of right leg Does appear the patient may be having more of a drug reaction with patient having the association with the Levaquin.  We discussed the importance of monitoring for any type of tendon injuries.  Short course of prednisone, home  exercises, discussed avoiding anything such as anything with high impact at the moment.  Follow-up with me again in 6 to 8 weeks  Piriformis syndrome, right Held on any type of injections secondary to patient having more of a tendinitis secondary to potentially be drug Levaquin at the moment.  We did discuss that we can continue to monitor, increase activity slowly over the course neck several weeks.  Follow-up with me again in 6 to 8 weeks.    Nonallopathic problems  Decision today to treat with OMT was  based on Physical Exam  After verbal consent patient was treated with HVLA, ME, FPR techniques in cervical, rib, thoracic, lumbar, and sacral  areas avoided HVLA on the neck  Patient tolerated the procedure well with improvement in symptoms  Patient given exercises, stretches and lifestyle modifications  See medications in patient instructions if given  Patient will follow up in 4-8 weeks     The above documentation has been reviewed and is accurate and complete [name], DO         Note: This dictation was prepared with Dragon dictation along with smaller phrase technology. Any transcriptional errors that result from this process are unintentional.

## 2022-03-17 ENCOUNTER — Other Ambulatory Visit: Payer: Self-pay | Admitting: Internal Medicine

## 2022-03-17 DIAGNOSIS — R1013 Epigastric pain: Secondary | ICD-10-CM

## 2022-03-18 ENCOUNTER — Ambulatory Visit (INDEPENDENT_AMBULATORY_CARE_PROVIDER_SITE_OTHER): Payer: BC Managed Care – PPO | Admitting: Family Medicine

## 2022-03-18 VITALS — BP 120/76 | HR 89 | Ht 73.0 in | Wt 247.0 lb

## 2022-03-18 DIAGNOSIS — M9908 Segmental and somatic dysfunction of rib cage: Secondary | ICD-10-CM

## 2022-03-18 DIAGNOSIS — M9901 Segmental and somatic dysfunction of cervical region: Secondary | ICD-10-CM | POA: Diagnosis not present

## 2022-03-18 DIAGNOSIS — M62838 Other muscle spasm: Secondary | ICD-10-CM | POA: Diagnosis not present

## 2022-03-18 DIAGNOSIS — M9902 Segmental and somatic dysfunction of thoracic region: Secondary | ICD-10-CM | POA: Diagnosis not present

## 2022-03-18 DIAGNOSIS — G5701 Lesion of sciatic nerve, right lower limb: Secondary | ICD-10-CM | POA: Diagnosis not present

## 2022-03-18 DIAGNOSIS — M9903 Segmental and somatic dysfunction of lumbar region: Secondary | ICD-10-CM

## 2022-03-18 DIAGNOSIS — M9904 Segmental and somatic dysfunction of sacral region: Secondary | ICD-10-CM

## 2022-03-18 MED ORDER — PREDNISONE 20 MG PO TABS: ORAL_TABLET | ORAL | 0 refills | Status: DC

## 2022-03-18 MED ORDER — HYDROCODONE-ACETAMINOPHEN 5-325 MG PO TABS: 1.0000 | ORAL_TABLET | Freq: Two times a day (BID) | ORAL | 0 refills | Status: AC | PRN

## 2022-03-18 NOTE — Patient Instructions (Addendum)
Good to see you '200mg'$  CoQ10 for 4 weeks Pred 40 for 5 days, 20 for 5 days, 10 for 5 day See me again in 6 weeks

## 2022-03-18 NOTE — Assessment & Plan Note (Signed)
Held on any type of injections secondary to patient having more of a tendinitis secondary to potentially be drug Levaquin at the moment.  We did discuss that we can continue to monitor, increase activity slowly over the course neck several weeks.  Follow-up with me again in 6 to 8 weeks.

## 2022-03-18 NOTE — Assessment & Plan Note (Signed)
Does appear the patient may be having more of a drug reaction with patient having the association with the Levaquin.  We discussed the importance of monitoring for any type of tendon injuries.  Short course of prednisone, home exercises, discussed avoiding anything such as anything with high impact at the moment.  Follow-up with me again in 6 to 8 weeks

## 2022-04-24 NOTE — Progress Notes (Unsigned)
[name] [address] Kyle [name] Phone: [phone] Subjective:   [name], am serving as a scribe for Dr. Hulan Saas.  I'm seeing this patient by the request  of:  [name], MD  CC: back and neck pain   [id number]  Kyle [name] is a 52 y.o. male coming in with complaint of back and neck pain. OMT on 03/18/2022. Patient states doing well. No new concerns.  Medications patient has been prescribed:   Taking:         Reviewed prior external information including notes and imaging from previsou exam, outside providers and external EMR if available.   As well as notes that were available from care everywhere and other healthcare systems.  Past medical history, social, surgical and family history all reviewed in electronic medical record.  No pertanent information unless stated regarding to the chief complaint.   Past Medical History:  Diagnosis Date   Allergy    Arthritis    Asthma    Cataract    small right eye    Constipation    Diverticulosis    Family history of colonic polyps    dad and sister    GERD (gastroesophageal reflux disease)    Neuromuscular disorder (HCC)    spine   Neuropathy of foot    right and mild    Allergies  Allergen Reactions   Azithromycin Other (See Comments)    Possible elevated LFTs Elevated liver enzymes Possible elevated LFTs    Cyclosporine Hives   Meperidine Hives   Levaquin [Levofloxacin]     Muscle pains   Penicillins Swelling    Neck swelling      Review of Systems:  No headache, visual changes, nausea, vomiting, diarrhea, constipation, dizziness, abdominal pain, skin rash, fevers, chills, night sweats, weight loss, swollen lymph nodes, body aches, joint swelling, chest pain, shortness of breath, mood changes. POSITIVE muscle aches  Objective  Blood pressure 118/84, pulse 81, height 6' 1"$  (1.854 m), weight 253 lb (114.8 kg), SpO2 96 %.   General:  No apparent distress alert and oriented x3 mood and affect normal, dressed appropriately.  HEENT: Pupils equal, extraocular movements intact  Respiratory: Patient's speak in full sentences and does not appear short of breath  Cardiovascular: No lower extremity edema, non tender, no erythema  Low back exam does have some loss of lordosis.  Some tenderness to palpation of the paraspinal musculature.  Osteopathic findings  C6 flexed rotated and side bent left T3 extended rotated and side bent right inhaled rib T8 extended rotated and side bent left L2 flexed rotated and side bent right Sacrum right on right       Assessment and Plan:  Lumbar radiculopathy Continues to have significant tightness.  I am concerned that some of the knee pain he is having is also radicular symptoms noted.  We discussed with patient about icing regimen and home exercises, which activities to do and which ones to avoid.  Discussed core strengthening.  Discussed with patient is including the hydrocodone and the gabapentin follow-up with me again in 6 weeks.  Can consider an epidural if necessary as well.    Nonallopathic problems  Decision today to treat with OMT was based on Physical Exam  After verbal consent patient was treated with HVLA, ME, FPR techniques in cervical, rib, thoracic, lumbar, and sacral  areas  Patient tolerated the procedure well with improvement in symptoms  Patient given exercises,  stretches and lifestyle modifications  See medications in patient instructions if given  Patient will follow up in 4-8 weeks     The above documentation has been reviewed and is accurate and complete [name], DO         Note: This dictation was prepared with Dragon dictation along with smaller phrase technology. Any transcriptional errors that result from this process are unintentional.

## 2022-04-29 ENCOUNTER — Ambulatory Visit (INDEPENDENT_AMBULATORY_CARE_PROVIDER_SITE_OTHER): Payer: BC Managed Care – PPO | Admitting: Family Medicine

## 2022-04-29 ENCOUNTER — Encounter: Payer: Self-pay | Admitting: Family Medicine

## 2022-04-29 VITALS — BP 118/84 | HR 81 | Ht 73.0 in | Wt 253.0 lb

## 2022-04-29 DIAGNOSIS — M9901 Segmental and somatic dysfunction of cervical region: Secondary | ICD-10-CM

## 2022-04-29 DIAGNOSIS — M9904 Segmental and somatic dysfunction of sacral region: Secondary | ICD-10-CM | POA: Diagnosis not present

## 2022-04-29 DIAGNOSIS — M9902 Segmental and somatic dysfunction of thoracic region: Secondary | ICD-10-CM

## 2022-04-29 DIAGNOSIS — M9903 Segmental and somatic dysfunction of lumbar region: Secondary | ICD-10-CM | POA: Diagnosis not present

## 2022-04-29 DIAGNOSIS — M5416 Radiculopathy, lumbar region: Secondary | ICD-10-CM | POA: Diagnosis not present

## 2022-04-29 DIAGNOSIS — M9908 Segmental and somatic dysfunction of rib cage: Secondary | ICD-10-CM

## 2022-04-29 NOTE — Assessment & Plan Note (Signed)
Continues to have significant tightness.  I am concerned that some of the knee pain he is having is also radicular symptoms noted.  We discussed with patient about icing regimen and home exercises, which activities to do and which ones to avoid.  Discussed core strengthening.  Discussed with patient is including the hydrocodone and the gabapentin follow-up with me again in 6 weeks.  Can consider an epidural if necessary as well.

## 2022-06-12 ENCOUNTER — Other Ambulatory Visit: Payer: Self-pay | Admitting: Internal Medicine

## 2022-06-12 DIAGNOSIS — R1013 Epigastric pain: Secondary | ICD-10-CM

## 2022-06-13 NOTE — Progress Notes (Unsigned)
[name] [address] [place] [address] Phone: [phone] Subjective:   INadine Counts, am serving as a scribe for Dr. Antoine Primas.  I'm seeing this patient by the request  of:  Langley Gauss, MD  CC: Back and neck pain follow-up  [id number]  Kyle [name] is a 52 y.o. male coming in with complaint of back and neck pain. OMT 04/29/2022. Patient states here for manipulation. Seeing if it's too soon for injections in knees, etc. patient though unfortunately continues to have difficulty with the help of his father that has caused him to sleep on a couch recently.  Medications patient has been prescribed: None  Taking:         Reviewed prior external information including notes and imaging from previsou exam, outside providers and external EMR if available.   As well as notes that were available from care everywhere and other healthcare systems.  Past medical history, social, surgical and family history all reviewed in electronic medical record.  No pertanent information unless stated regarding to the chief complaint.   Past Medical History:  Diagnosis Date   Allergy    Arthritis    Asthma    Cataract    small right eye    Constipation    Diverticulosis    Family history of colonic polyps    dad and sister    GERD (gastroesophageal reflux disease)    Neuromuscular disorder (HCC)    spine   Neuropathy of foot    right and mild    Allergies  Allergen Reactions   Azithromycin Other (See Comments)    Possible elevated LFTs Elevated liver enzymes Possible elevated LFTs    Cyclosporine Hives   Meperidine Hives   Levaquin [Levofloxacin]     Muscle pains   Penicillins Swelling    Neck swelling      Review of Systems:  No headache, visual changes, nausea, vomiting, diarrhea, constipation, dizziness, abdominal pain, skin rash, fevers, chills, night sweats, weight loss, swollen lymph nodes, body aches, joint  swelling, chest pain, shortness of breath, mood changes. POSITIVE muscle aches  Objective  Blood pressure 118/72, pulse 78, height 6\' 1"  (1.854 m), weight 249 lb (112.9 kg), SpO2 97 %.   General: No apparent distress alert and oriented x3 mood and affect normal, dressed appropriately.  HEENT: Pupils equal, extraocular movements intact  Respiratory: Patient's speak in full sentences and does not appear short of breath  Cardiovascular: No lower extremity edema, non tender, no erythema  Loss of lordosis tightness with FABER on right side  Back does have SLT right sided.  Neurovascularly intact distally. Knee exams do have crepitus of the patellofemoral joint bilaterally.   Osteopathic findings  T5 extended rotated and side bent right inhaled rib T8 extended rotated and side bent left L1 flexed rotated and side bent right Sacrum right on right       Assessment and Plan:  Lumbar radiculopathy Tightness of the back noted again.  We discussed with patient about icing regimen and home exercises.  If starting to have spasms again could be a candidate for the potential for the epidurals.  Increase activity slowly.  Does have a bilateral knee pain as well we will continue to monitor.  Patient wanted to hold on any type of injections today.  Discussed other medications including muscle relaxers in great detail today.  Follow-up again in 6 to 8 weeks  Piriformis syndrome, right Discussed the potential need  for any other injection.  Been greater than 1 year.  Discussed posture and ergonomics otherwise.  Continue to work on core strengthening.  Differential includes the lumbar radiculopathy.    Nonallopathic problems  Decision today to treat with OMT was based on Physical Exam  After verbal consent patient was treated with HVLA, ME, FPR techniques in  rib, thoracic, lumbar, and sacral  areas  Patient tolerated the procedure well with improvement in symptoms  Patient given exercises,  stretches and lifestyle modifications  See medications in patient instructions if given  Patient will follow up in 4-8 weeks    The above documentation has been reviewed and is accurate and complete Judi SaaZachary M Derriona Branscom, DO          Note: This dictation was prepared with Dragon dictation along with smaller phrase technology. Any transcriptional errors that result from this process are unintentional.

## 2022-06-17 ENCOUNTER — Ambulatory Visit (INDEPENDENT_AMBULATORY_CARE_PROVIDER_SITE_OTHER): Payer: BC Managed Care – PPO | Admitting: Family Medicine

## 2022-06-17 ENCOUNTER — Encounter: Payer: Self-pay | Admitting: Family Medicine

## 2022-06-17 VITALS — BP 118/72 | HR 78 | Ht 73.0 in | Wt 249.0 lb

## 2022-06-17 DIAGNOSIS — M9901 Segmental and somatic dysfunction of cervical region: Secondary | ICD-10-CM | POA: Diagnosis not present

## 2022-06-17 DIAGNOSIS — M9908 Segmental and somatic dysfunction of rib cage: Secondary | ICD-10-CM | POA: Diagnosis not present

## 2022-06-17 DIAGNOSIS — M9902 Segmental and somatic dysfunction of thoracic region: Secondary | ICD-10-CM | POA: Diagnosis not present

## 2022-06-17 DIAGNOSIS — M5416 Radiculopathy, lumbar region: Secondary | ICD-10-CM | POA: Diagnosis not present

## 2022-06-17 DIAGNOSIS — M9903 Segmental and somatic dysfunction of lumbar region: Secondary | ICD-10-CM | POA: Diagnosis not present

## 2022-06-17 DIAGNOSIS — G5701 Lesion of sciatic nerve, right lower limb: Secondary | ICD-10-CM

## 2022-06-17 DIAGNOSIS — M9904 Segmental and somatic dysfunction of sacral region: Secondary | ICD-10-CM

## 2022-06-17 NOTE — Assessment & Plan Note (Signed)
Tightness of the back noted again.  We discussed with patient about icing regimen and home exercises.  If starting to have spasms again could be a candidate for the potential for the epidurals.  Increase activity slowly.  Does have a bilateral knee pain as well we will continue to monitor.  Patient wanted to hold on any type of injections today.  Discussed other medications including muscle relaxers in great detail today.  Follow-up again in 6 to 8 weeks

## 2022-06-17 NOTE — Patient Instructions (Signed)
Good to see you! Hopefully you get off the couch soon See you again in 7-8 Weeks

## 2022-06-17 NOTE — Assessment & Plan Note (Signed)
Discussed the potential need for any other injection.  Been greater than 1 year.  Discussed posture and ergonomics otherwise.  Continue to work on core strengthening.  Differential includes the lumbar radiculopathy.

## 2022-07-07 ENCOUNTER — Other Ambulatory Visit: Payer: Self-pay | Admitting: Internal Medicine

## 2022-07-07 DIAGNOSIS — K582 Mixed irritable bowel syndrome: Secondary | ICD-10-CM

## 2022-07-07 MED ORDER — DICYCLOMINE HCL 10 MG PO CAPS: 20.0000 mg | ORAL_CAPSULE | Freq: Four times a day (QID) | ORAL | 3 refills | Status: DC | PRN

## 2022-07-07 NOTE — Progress Notes (Signed)
Refill bentyl 

## 2022-07-31 NOTE — Progress Notes (Signed)
[name] [address] [place] [address] Phone: [phone] Subjective:   INadine Counts, am serving as a scribe for Dr. Antoine Primas.  I'm seeing this patient by the request  of:  Langley Gauss, MD  CC: back and neck pain follow up   [id number]  ABHIJEET CLEAVENGER is a 52 y.o. male coming in with complaint of back and neck pain. OMT 06/17/2022. Patient states same per usual. Little better. No new concerns.  Patient has had some tightness noted around the right knee.  Sometimes gives him a little instability.  Patient does not know if it is his back or his knees specifically.  Continues to try to stay active.  Medications patient has been prescribed: None  Taking:      Reviewed prior external information including notes and imaging from previsou exam, outside providers and external EMR if available.   As well as notes that were available from care everywhere and other healthcare systems.  Past medical history, social, surgical and family history all reviewed in electronic medical record.  No pertanent information unless stated regarding to the chief complaint.   Past Medical History:  Diagnosis Date   Allergy    Arthritis    Asthma    Cataract    small right eye    Constipation    Diverticulosis    Family history of colonic polyps    dad and sister    GERD (gastroesophageal reflux disease)    Neuromuscular disorder (HCC)    spine   Neuropathy of foot    right and mild    Allergies  Allergen Reactions   Azithromycin Other (See Comments)    Possible elevated LFTs Elevated liver enzymes Possible elevated LFTs    Cyclosporine Hives   Meperidine Hives   Levaquin [Levofloxacin]     Muscle pains   Penicillins Swelling    Neck swelling      Review of Systems:  No headache, visual changes, nausea, vomiting, diarrhea, constipation, dizziness, abdominal pain, skin rash, fevers, chills, night sweats, weight loss, swollen  lymph nodes, body aches, joint swelling, chest pain, shortness of breath, mood changes. POSITIVE muscle aches  Objective  Blood pressure 118/80, pulse 76, height 6\' 1"  (1.854 m), weight 250 lb (113.4 kg), SpO2 98 %.   General: No apparent distress alert and oriented x3 mood and affect normal, dressed appropriately.  HEENT: Pupils equal, extraocular movements intact  Respiratory: Patient's speak in full sentences and does not appear short of breath  Cardiovascular: No lower extremity edema, non tender, no erythema  Neck exam does have some limited sidebending bilaterally.  Back exam tightness in the right parascapular area.  Significant tightness in the right Lower back as well noted.  Tightness with Pearlean Brownie right greater than left.  Osteopathic findings  C2 flexed rotated and side bent right C6 flexed rotated and side bent left T3 extended rotated and side bent right inhaled rib T9 extended rotated and side bent left L2 flexed rotated and side bent right L4 flexed rotated and side bent right Sacrum right on right    Assessment and Plan:  Lumbar radiculopathy Will continue to monitor.  Discussed icing regimen and home exercises.  Discussed the gabapentin, refilled Flexeril for this chronic problem with worsening symptoms as well.  Increase activity slowly.  Follow-up again in 6 to 8 weeks  Bilateral knee pain Bilateral knee pain, 1 year since injections.  May need again at some point if  continuing to have difficulty.  Follow-up with me again in 6 to 8 weeks    Nonallopathic problems  Decision today to treat with OMT was based on Physical Exam  After verbal consent patient was treated with HVLA, ME, FPR techniques in cervical, rib, thoracic, lumbar, and sacral  areas  Patient tolerated the procedure well with improvement in symptoms  Patient given exercises, stretches and lifestyle modifications  See medications in patient instructions if given  Patient will follow up in 4-8  weeks     The above documentation has been reviewed and is accurate and complete Judi Saa, DO         Note: This dictation was prepared with Dragon dictation along with smaller phrase technology. Any transcriptional errors that result from this process are unintentional.

## 2022-08-05 ENCOUNTER — Ambulatory Visit (INDEPENDENT_AMBULATORY_CARE_PROVIDER_SITE_OTHER): Payer: BC Managed Care – PPO | Admitting: Family Medicine

## 2022-08-05 ENCOUNTER — Encounter: Payer: Self-pay | Admitting: Family Medicine

## 2022-08-05 VITALS — BP 118/80 | HR 76 | Ht 73.0 in | Wt 250.0 lb

## 2022-08-05 DIAGNOSIS — M9903 Segmental and somatic dysfunction of lumbar region: Secondary | ICD-10-CM

## 2022-08-05 DIAGNOSIS — G8929 Other chronic pain: Secondary | ICD-10-CM | POA: Diagnosis not present

## 2022-08-05 DIAGNOSIS — M9901 Segmental and somatic dysfunction of cervical region: Secondary | ICD-10-CM | POA: Diagnosis not present

## 2022-08-05 DIAGNOSIS — M9904 Segmental and somatic dysfunction of sacral region: Secondary | ICD-10-CM | POA: Diagnosis not present

## 2022-08-05 DIAGNOSIS — M25562 Pain in left knee: Secondary | ICD-10-CM | POA: Diagnosis not present

## 2022-08-05 DIAGNOSIS — M9908 Segmental and somatic dysfunction of rib cage: Secondary | ICD-10-CM | POA: Diagnosis not present

## 2022-08-05 DIAGNOSIS — M5416 Radiculopathy, lumbar region: Secondary | ICD-10-CM | POA: Diagnosis not present

## 2022-08-05 DIAGNOSIS — M25561 Pain in right knee: Secondary | ICD-10-CM

## 2022-08-05 DIAGNOSIS — M9902 Segmental and somatic dysfunction of thoracic region: Secondary | ICD-10-CM

## 2022-08-05 MED ORDER — CYCLOBENZAPRINE HCL 5 MG PO TABS: 5.0000 mg | ORAL_TABLET | Freq: Three times a day (TID) | ORAL | 0 refills | Status: DC | PRN

## 2022-08-05 NOTE — Assessment & Plan Note (Signed)
Will continue to monitor.  Discussed icing regimen and home exercises.  Discussed the gabapentin, refilled Flexeril for this chronic problem with worsening symptoms as well.  Increase activity slowly.  Follow-up again in 6 to 8 weeks

## 2022-08-05 NOTE — Patient Instructions (Addendum)
See you again in 8 weeks Flexeril refilled

## 2022-08-05 NOTE — Assessment & Plan Note (Signed)
Bilateral knee pain, 1 year since injections.  May need again at some point if continuing to have difficulty.  Follow-up with me again in 6 to 8 weeks

## 2022-08-18 ENCOUNTER — Other Ambulatory Visit: Payer: Self-pay | Admitting: Internal Medicine

## 2022-08-18 DIAGNOSIS — R110 Nausea: Secondary | ICD-10-CM

## 2022-08-18 DIAGNOSIS — K582 Mixed irritable bowel syndrome: Secondary | ICD-10-CM

## 2022-08-18 MED ORDER — ONDANSETRON HCL 4 MG PO TABS: 4.0000 mg | ORAL_TABLET | Freq: Three times a day (TID) | ORAL | 1 refills | Status: DC | PRN

## 2022-08-18 NOTE — Progress Notes (Signed)
PRN nausea,  Refill

## 2022-10-01 NOTE — Progress Notes (Unsigned)
[name] [address] [place] [address] Phone: [phone] Subjective:   INadine Counts, am serving as a scribe for Dr. Antoine Primas.  I'm seeing this patient by the request  of:  Langley Gauss, MD  CC: Back and neck pain follow-up  [id number]  Kyle [name] is a 52 y.o. male coming in with complaint of back and neck pain. OMT 08/05/2022. Patient states same per usual. Injection in  B piriformis today?  Medications patient has been prescribed: Flexeril  Taking:         Reviewed prior external information including notes and imaging from previsou exam, outside providers and external EMR if available.   As well as notes that were available from care everywhere and other healthcare systems.  Past medical history, social, surgical and family history all reviewed in electronic medical record.  No pertanent information unless stated regarding to the chief complaint.   Past Medical History:  Diagnosis Date   Allergy    Arthritis    Asthma    Cataract    small right eye    Constipation    Diverticulosis    Family history of colonic polyps    dad and sister    GERD (gastroesophageal reflux disease)    Neuromuscular disorder (HCC)    spine   Neuropathy of foot    right and mild    Allergies  Allergen Reactions   Azithromycin Other (See Comments)    Possible elevated LFTs Elevated liver enzymes Possible elevated LFTs    Cyclosporine Hives   Meperidine Hives   Levaquin [Levofloxacin]     Muscle pains   Penicillins Swelling    Neck swelling      Review of Systems:  No headache, visual changes, nausea, vomiting, diarrhea, constipation, dizziness, abdominal pain, skin rash, fevers, chills, night sweats, weight loss, swollen lymph nodes, body aches, joint swelling, chest pain, shortness of breath, mood changes. POSITIVE muscle aches  Objective  Blood pressure 124/68, pulse 80, height 6\' 1"  (1.854 m), weight 253 lb  (114.8 kg), SpO2 98%.   General: No apparent distress alert and oriented x3 mood and affect normal, dressed appropriately.  HEENT: Pupils equal, extraocular movements intact  Respiratory: Patient's speak in full sentences and does not appear short of breath  Cardiovascular: No lower extremity edema, non tender, no erythema  Right lower back does show that there is more tenderness to palpation over the piriformis.  Worsening pain with extension of the back noted.  Patient does have some tenderness to palpation in the left knee.  Does have some crepitus noted.  Osteopathic findings  C2 flexed rotated and side bent right C6 flexed rotated and side bent left T3 extended rotated and side bent right inhaled rib T9 extended rotated and side bent left L2 flexed rotated and side bent right Sacrum right on right   After informed written and verbal consent, patient was seated on exam table. Left knee was prepped with alcohol swab and utilizing anterolateral approach, patient's left knee space was injected with 4:1  marcaine 0.5%: Kenalog 40mg /dL. Patient tolerated the procedure well without immediate complications.  Procedure: Real-time Ultrasound Guided Injection of right piriformis tendon sheath Device: GE Logiq Q7 Ultrasound guided injection is preferred based studies that show increased duration, increased effect, greater accuracy, decreased procedural pain, increased response rate, and decreased cost with ultrasound guided versus blind injection.  Verbal informed consent obtained.  Time-out conducted.  Noted no overlying erythema, induration,  or other signs of local infection.  Skin prepped in a sterile fashion.  Local anesthesia: Topical Ethyl chloride.  With sterile technique and under real time ultrasound guidance: With a 21-gauge 2 inch needle injected into the piriformis with a total of 1 cc of 0.5% Marcaine and 1 cc of Kenalog 40 mg/mL Completed without difficulty  Pain immediately  improved suggesting accurate placement of the medication.  Advised to call if fevers/chills, erythema, induration, drainage, or persistent bleeding.  Impression: Technically successful ultrasound guided injection.    Assessment and Plan:  No problem-specific Assessment & Plan notes found for this encounter.    Nonallopathic problems  Decision today to treat with OMT was based on Physical Exam  After verbal consent patient was treated with HVLA, ME, FPR techniques in cervical, rib, thoracic, lumbar, and sacral  areas  Patient tolerated the procedure well with improvement in symptoms  Patient given exercises, stretches and lifestyle modifications  See medications in patient instructions if given  Patient will follow up in 4-8 weeks             Note: This dictation was prepared with Dragon dictation along with smaller phrase technology. Any transcriptional errors that result from this process are unintentional.

## 2022-10-02 ENCOUNTER — Other Ambulatory Visit: Payer: Self-pay

## 2022-10-02 ENCOUNTER — Ambulatory Visit (INDEPENDENT_AMBULATORY_CARE_PROVIDER_SITE_OTHER): Payer: BC Managed Care – PPO | Admitting: Family Medicine

## 2022-10-02 ENCOUNTER — Encounter: Payer: Self-pay | Admitting: Family Medicine

## 2022-10-02 VITALS — BP 124/68 | HR 80 | Ht 73.0 in | Wt 253.0 lb

## 2022-10-02 DIAGNOSIS — G5701 Lesion of sciatic nerve, right lower limb: Secondary | ICD-10-CM

## 2022-10-02 DIAGNOSIS — M9904 Segmental and somatic dysfunction of sacral region: Secondary | ICD-10-CM

## 2022-10-02 DIAGNOSIS — M9908 Segmental and somatic dysfunction of rib cage: Secondary | ICD-10-CM | POA: Diagnosis not present

## 2022-10-02 DIAGNOSIS — M9903 Segmental and somatic dysfunction of lumbar region: Secondary | ICD-10-CM | POA: Diagnosis not present

## 2022-10-02 DIAGNOSIS — M9901 Segmental and somatic dysfunction of cervical region: Secondary | ICD-10-CM | POA: Diagnosis not present

## 2022-10-02 DIAGNOSIS — M1712 Unilateral primary osteoarthritis, left knee: Secondary | ICD-10-CM | POA: Diagnosis not present

## 2022-10-02 DIAGNOSIS — M9902 Segmental and somatic dysfunction of thoracic region: Secondary | ICD-10-CM | POA: Diagnosis not present

## 2022-10-02 NOTE — Assessment & Plan Note (Signed)
New problem with similar to contralateral side.  Discussed which activities to do and which ones to avoid.  Increase activity slowly.  Discussed icing regimen.  Follow-up again in 6 to 8 weeks.

## 2022-10-02 NOTE — Assessment & Plan Note (Signed)
Chronic problem with worsening symptoms.  Having pain on the contralateral side.  Concerned that some of it could be lumbar radiculopathy.  Attempted osteopathic manipulation as well today.  Did have some improvement after both of these interventions.  Discussed icing regimen, discussed medications including restarting the allopurinol.  Follow-up again in 6 to 8 weeks.

## 2022-10-02 NOTE — Patient Instructions (Addendum)
Injection in knee and piriformis See you again in 6-8 weeks

## 2022-11-17 NOTE — Progress Notes (Unsigned)
[name] [address] [place] [address] Phone: [phone] Subjective:   Kyle [name], am serving as a scribe for Dr. Antoine Primas.  I'm seeing this patient by the request  of:  Langley Gauss, MD  CC: Back pain, neck pain and new onset foot pain  [id number]  Kyle [name] is a 52 y.o. male coming in with complaint of back and neck pain. OMT 10/02/2022. Also R hip and L knee pain f/u. Patient states that he is having some R heel pain over the bottom of the heel and in achilles insertion. 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           Reviewed prior external information including notes and imaging from previsou exam, outside providers and external EMR if available.   As well as notes that were available from care everywhere and other healthcare systems.  Past medical history, social, surgical and family history all reviewed in electronic medical record.  No pertanent information unless stated regarding to the chief complaint.   Past Medical History:  Diagnosis Date   Allergy    Arthritis    Asthma    Cataract    small right eye    Constipation    Diverticulosis    Family history of colonic polyps    dad and sister    GERD (gastroesophageal reflux disease)    Neuromuscular disorder (HCC)    spine   Neuropathy of foot    right and mild    Allergies  Allergen Reactions   Azithromycin Other (See Comments)    Possible elevated LFTs Elevated liver enzymes Possible elevated LFTs    Cyclosporine Hives   Meperidine Hives   Levaquin [Levofloxacin]     Muscle pains   Penicillins Swelling    Neck swelling      Review of Systems:  No headache, visual changes, nausea, vomiting, diarrhea, constipation, dizziness, abdominal pain, skin rash, fevers, chills, night sweats, weight loss, swollen lymph nodes, body aches, joint swelling, chest pain, shortness of breath, mood changes. POSITIVE muscle  aches  Objective  Blood pressure 106/80, pulse 65, height 6\' 1"  (1.854 m), weight 248 lb (112.5 kg), SpO2 98%.   General: No apparent distress alert and oriented x3 mood and affect normal, dressed appropriately.  HEENT: Pupils equal, extraocular movements intact  Respiratory: Patient's speak in full sentences and does not appear short of breath  Cardiovascular: No lower extremity edema, non tender, no erythema  Gait MSK:  Back does have some loss of lordosis noted.  Some tenderness to palpation of the paraspinal musculature.  Significant tightness noted in the neck area.  Patient does have some limited sidebending bilaterally. Right foot exam shows tenderness to palpation over the medial calcaneal area.  Patient does have some tenderness to palpation  Osteopathic findings  C2 flexed rotated and side bent right C7 flexed rotated and side bent left T3 extended rotated and side bent right inhaled rib T9 extended rotated and side bent left L2 flexed rotated and side bent right Sacrum right on right  Limited muscular skeletal ultrasound was performed and interpreted by Antoine Primas, M  Limited ultrasound of patient's heel does show that there is some hypoechoic changes noted.  Does have some mild thickening noted.  No true tearing appreciated.  Patient though does also have some narrowing of the tarsal tunnel noted.   Assessment and Plan:  Lumbar radiculopathy Continue to monitor closely.  Patient  initially did have difficulty with radicular symptoms in the back.  Discussed icing regimen and home exercises.  Increase activity slowly over the course the next several weeks.  Follow-up again in 6 to 8 weeks.  Refilled medications accordingly including muscle relaxers as well as gabapentin.  Plantar fasciitis of right foot We reviewed that stretching is critically important to the treatment of PF.  Reviewed footwear. Rigid soles have been shown to help with PF. Night splints can sometimes  help. Reviewed rehab of stretching and calf raises.   Could benefit from a corticosteroid injection, orthotics, or other measures if conservative treatment fails. Discussed recovery sandals as well.    Nonallopathic problems  Decision today to treat with OMT was based on Physical Exam  After verbal consent patient was treated with HVLA, ME, FPR techniques in cervical, rib, thoracic, lumbar, and sacral  areas  Patient tolerated the procedure well with improvement in symptoms  Patient given exercises, stretches and lifestyle modifications  See medications in patient instructions if given  Patient will follow up in 4-8 weeks     The above documentation has been reviewed and is accurate and complete Judi Saa, DO         Note: This dictation was prepared with Dragon dictation along with smaller phrase technology. Any transcriptional errors that result from this process are unintentional.

## 2022-11-18 ENCOUNTER — Encounter: Payer: Self-pay | Admitting: Family Medicine

## 2022-11-18 ENCOUNTER — Ambulatory Visit (INDEPENDENT_AMBULATORY_CARE_PROVIDER_SITE_OTHER): Payer: BC Managed Care – PPO | Admitting: Family Medicine

## 2022-11-18 ENCOUNTER — Other Ambulatory Visit: Payer: Self-pay

## 2022-11-18 VITALS — BP 106/80 | HR 65 | Ht 73.0 in | Wt 248.0 lb

## 2022-11-18 DIAGNOSIS — M5416 Radiculopathy, lumbar region: Secondary | ICD-10-CM | POA: Diagnosis not present

## 2022-11-18 DIAGNOSIS — M79671 Pain in right foot: Secondary | ICD-10-CM

## 2022-11-18 DIAGNOSIS — M9901 Segmental and somatic dysfunction of cervical region: Secondary | ICD-10-CM | POA: Diagnosis not present

## 2022-11-18 DIAGNOSIS — M722 Plantar fascial fibromatosis: Secondary | ICD-10-CM | POA: Diagnosis not present

## 2022-11-18 DIAGNOSIS — M9902 Segmental and somatic dysfunction of thoracic region: Secondary | ICD-10-CM

## 2022-11-18 DIAGNOSIS — M9903 Segmental and somatic dysfunction of lumbar region: Secondary | ICD-10-CM

## 2022-11-18 DIAGNOSIS — M9908 Segmental and somatic dysfunction of rib cage: Secondary | ICD-10-CM

## 2022-11-18 DIAGNOSIS — M9904 Segmental and somatic dysfunction of sacral region: Secondary | ICD-10-CM

## 2022-11-18 NOTE — Patient Instructions (Signed)
Plantar fasciitis exercises Use orthotics in bowling shoes Recovery sandals See me again in 2 months

## 2022-11-18 NOTE — Assessment & Plan Note (Addendum)
Continue to monitor closely.  Patient initially did have difficulty with radicular symptoms in the back.  Discussed icing regimen and home exercises.  Increase activity slowly over the course the next several weeks.  Follow-up again in 6 to 8 weeks.  Refilled medications accordingly including muscle relaxers as well as gabapentin.

## 2022-11-18 NOTE — Assessment & Plan Note (Signed)
We reviewed that stretching is critically important to the treatment of PF.  Reviewed footwear. Rigid soles have been shown to help with PF. Night splints can sometimes help. Reviewed rehab of stretching and calf raises.   Could benefit from a corticosteroid injection, orthotics, or other measures if conservative treatment fails. Discussed recovery sandals as well.

## 2022-12-09 ENCOUNTER — Ambulatory Visit (INDEPENDENT_AMBULATORY_CARE_PROVIDER_SITE_OTHER): Payer: BC Managed Care – PPO | Admitting: Family Medicine

## 2022-12-09 ENCOUNTER — Encounter: Payer: Self-pay | Admitting: Family Medicine

## 2022-12-09 VITALS — BP 124/84 | HR 76 | Ht 73.0 in | Wt 246.0 lb

## 2022-12-09 DIAGNOSIS — M722 Plantar fascial fibromatosis: Secondary | ICD-10-CM | POA: Diagnosis not present

## 2022-12-09 DIAGNOSIS — G5701 Lesion of sciatic nerve, right lower limb: Secondary | ICD-10-CM

## 2022-12-09 MED ORDER — PREDNISONE 50 MG PO TABS: ORAL_TABLET | ORAL | 0 refills | Status: DC

## 2022-12-09 NOTE — Progress Notes (Signed)
[name] [address] [place] [address] Phone: [phone] Subjective:   Kyle [name], am serving as a scribe for Dr. Antoine [name].  I'm seeing this patient by the request  of:  Kyle Gauss, MD  CC: low back pain   [name]  Kyle [name] is a 52 y.o. male coming in with complaint of lumbar radiculopathy. Patient states that his achilles is  causing popping in fibular head and he feels like it is pulling on heel. Nerve pain in heel as well. Last week his pain increased.   Also having R hip pain. Would like piriformis injection.     Past Medical History:  Diagnosis Date   Allergy    Arthritis    Asthma    Cataract    small right eye    Constipation    Diverticulosis    Family history of colonic polyps    dad and sister    GERD (gastroesophageal reflux disease)    Neuromuscular disorder (HCC)    spine   Neuropathy of foot    right and mild   Past Surgical History:  Procedure Laterality Date   ANTERIOR CRUCIATE LIGAMENT REPAIR     COLONOSCOPY     EXTERNAL EAR SURGERY     KNEE ARTHROSCOPY     lasik     Social History   Socioeconomic History   Marital status: Single    Spouse name: Not on file   Number of children: Not on file   Years of education: Not on file   Highest education level: Not on file  Occupational History   Not on file  Tobacco Use   Smoking status: Former   Smokeless tobacco: Never  Substance and Sexual Activity   Alcohol use: Yes    Comment: socially    Drug use: Never   Sexual activity: Not on file  Other Topics Concern   Not on file  Social History Narrative   Not on file   Social Determinants of Health   Financial Resource Strain: Low Risk  (06/02/2022)   Received from [facility], [facility]   Overall Financial Resource Strain (CARDIA)    Difficulty of Paying Living Expenses: Not hard at all  Food Insecurity: No Food Insecurity (06/02/2022)   Received from [facility], [facility]   Hunger Vital Sign    Worried About Running Out of Food in the Last Year: Never true    Ran Out of Food in the Last Year: Never true  Transportation Needs: No Transportation Needs (06/02/2022)   Received from [facility], [facility]   PRAPARE - Transportation    Lack of Transportation (Medical): No    Lack of Transportation (Non-Medical): No  Physical Activity: Unknown (06/02/2022)   Received from [facility], [facility]   Exercise Vital Sign    Days of Exercise per Week: 0 days    Minutes of Exercise per Session: Not on file  Stress: Stress Concern Present (06/02/2022)   Received from [facility] of Occupational Health - Occupational Stress Questionnaire    Feeling of Stress : To some extent  Social Connections: Socially Integrated (06/02/2022)   Received from [facility], [facility]   Social Network    How would you rate your social network (family, work, friends)?: Good participation with social networks   Allergies  Allergen Reactions   Azithromycin Other (See Comments)    Possible elevated LFTs  Elevated liver enzymes Possible elevated LFTs    Cyclosporine Hives   Meperidine Hives   Levaquin [Levofloxacin]     Muscle pains   Penicillins Swelling    Neck swelling    Family History  Problem Relation Age of Onset   Colon polyps Father    Colon polyps Sister    Colon cancer Paternal Aunt    Colon cancer Cousin    Heart disease Neg Hx    Rectal cancer Neg Hx    Stomach cancer Neg Hx     Current Outpatient Medications (Endocrine & Metabolic):    predniSONE (DELTASONE) 50 MG tablet, Take one tablet daily for the next 5 days.   Current Outpatient Medications (Respiratory):    albuterol (VENTOLIN HFA) 108 (90 Base) MCG/ACT inhaler, Inhale into the lungs.   cetirizine (ZYRTEC) 10 MG tablet, Take by mouth.  Current Outpatient Medications (Analgesics):    HYDROcodone-acetaminophen (NORCO/VICODIN)  5-325 MG tablet, Take 1 tablet by mouth every 12 (twelve) hours as needed for moderate pain.   colchicine 0.6 MG tablet, Take 1 tablet (0.6 mg total) by mouth daily.  Current Outpatient Medications (Hematological):    Cyanocobalamin (VITAMIN B 12 PO), Inject as directed. Every 10 days  Current Outpatient Medications (Other):    CRANBERRY PO, Take by mouth.   cyclobenzaprine (FLEXERIL) 5 MG tablet, Take 1 tablet (5 mg total) by mouth every 8 (eight) hours as needed for muscle spasms.   dicyclomine (BENTYL) 10 MG capsule, Take 2 capsules (20 mg total) by mouth 4 (four) times daily as needed for spasms.   gabapentin (NEURONTIN) 100 MG capsule, Take 2 capsules (200 mg total) by mouth 2 (two) times daily.   Magnesium Oxide 400 MG CAPS, Take by mouth.   Omega-3 1000 MG CAPS, Take by mouth.   ondansetron (ZOFRAN) 4 MG tablet, Take 1 tablet (4 mg total) by mouth every 8 (eight) hours as needed for nausea or vomiting.   pantoprazole (PROTONIX) 40 MG tablet, TAKE 1 TABLET BY MOUTH DAILY AT LEAST 30 MINUTES BEFORE FIRST MEAL OF THE DAY   UNABLE TO FIND, daily. Med Name: Beet Root   valACYclovir (VALTREX) 1000 MG tablet, Take 1 tablet (1,000 mg total) by mouth 2 (two) times daily.   phentermine 37.5 MG capsule, Take by mouth.   UNABLE TO FIND, daily. Med Name: Miralax OTC   Reviewed prior external information including notes and imaging from  primary care provider As well as notes that were available from care everywhere and other healthcare systems.  Past medical history, social, surgical and family history all reviewed in electronic medical record.  No pertanent information unless stated regarding to the chief complaint.   Review of Systems:  No headache, visual changes, nausea, vomiting, diarrhea, constipation, dizziness, abdominal pain, skin rash, fevers, chills, night sweats, weight loss, swollen lymph nodes, body aches, joint swelling, chest pain, shortness of breath, mood changes. POSITIVE  muscle aches  Objective  Blood pressure 124/84, pulse 76, height 6\' 1"  (1.854 m), weight 246 lb (111.6 kg), SpO2 97%.   General: No apparent distress alert and oriented x3 mood and affect normal, dressed appropriately.  HEENT: Pupils equal, extraocular movements intact  Respiratory: Patient's speak in full sentences and does not appear short of breath  Cardiovascular: No lower extremity edema, non tender, no erythema  Low back does have loss of lordosis  Tightness with FABER on right.  Foot exam TTP over the medial calaneal area patient does have some mild breakdown longitudinal and  transverse arch noted.  Limited muscular skeletal ultrasound was performed and interpreted by Kyle [name], M  Limited ultrasound Does not significant hypoechoic changes to the plantar fascia noted today.  Posterior calcaneal heel spur noted but no plantar heel spur  Procedure: Real-time Ultrasound Guided Injection of right piriformis tendon sheath Device: GE Logiq Q7 Ultrasound guided injection is preferred based studies that show increased duration, increased effect, greater accuracy, decreased procedural pain, increased response rate, and decreased cost with ultrasound guided versus blind injection.  Verbal informed consent obtained.   Noted no overlying erythema, induration, or other signs of local infection.  Skin prepped in a sterile fashion.  Local anesthesia: Topical Ethyl chloride.  With sterile technique and under real time ultrasound guidance: With a 21-gauge 2 inch needle injected into the piriformis noted.  Total of 2 cc of 0.5% Marcaine and 1 cc of Kenalog 40 mg/mL used. Completed without difficulty  Pain immediately resolved suggesting accurate placement of the medication.  Advised to call if fevers/chills, erythema, induration, drainage, or persistent bleeding.  Impression: Technically successful ultrasound guided injection. Unable to send pictures so no charge without ultrasound    Impression and Recommendations:    The above documentation has been reviewed and is accurate and complete Judi Saa, DO Oh

## 2022-12-09 NOTE — Assessment & Plan Note (Signed)
Patient given repeat injection and tolerated the procedure well on the right side.  Discussed with patient that I do think that there is some lumbar radiculopathy that could be contributing as well.  Will need to continue to monitor.  This did resolve significantly patient's pain in the foot.  Discussed the possibility of needing a plantar fascial injection but seem to be resolved after the injections will help at this time.  Total time 31 minutes

## 2022-12-09 NOTE — Assessment & Plan Note (Signed)
Will continue to monitor.  Worsening symptoms consider injection.  Prednisone given for any flares

## 2022-12-09 NOTE — Patient Instructions (Signed)
Injection in piriformis Prednisone 50mg  for 7 days See me in 6-8 weeks

## 2022-12-10 ENCOUNTER — Other Ambulatory Visit: Payer: Self-pay

## 2022-12-10 MED ORDER — PREDNISONE 50 MG PO TABS: ORAL_TABLET | ORAL | 0 refills | Status: DC

## 2022-12-11 ENCOUNTER — Ambulatory Visit: Payer: BC Managed Care – PPO | Admitting: Family Medicine

## 2022-12-16 ENCOUNTER — Ambulatory Visit (INDEPENDENT_AMBULATORY_CARE_PROVIDER_SITE_OTHER): Payer: BC Managed Care – PPO | Admitting: Family Medicine

## 2022-12-16 ENCOUNTER — Other Ambulatory Visit: Payer: Self-pay

## 2022-12-16 ENCOUNTER — Encounter: Payer: Self-pay | Admitting: Family Medicine

## 2022-12-16 VITALS — BP 118/82 | Ht 73.0 in | Wt 243.0 lb

## 2022-12-16 DIAGNOSIS — M79671 Pain in right foot: Secondary | ICD-10-CM | POA: Diagnosis not present

## 2022-12-16 DIAGNOSIS — M79672 Pain in left foot: Secondary | ICD-10-CM

## 2022-12-16 DIAGNOSIS — G5751 Tarsal tunnel syndrome, right lower limb: Secondary | ICD-10-CM | POA: Diagnosis not present

## 2022-12-16 NOTE — Assessment & Plan Note (Addendum)
New problem.  Patient given injection and tolerated the procedure well, discussed icing regimen and home exercises, discussed which activities to do and which ones to avoid.  Increase activity slowly otherwise.  Follow-up with me again in 6 to 8 weeks.

## 2022-12-16 NOTE — Patient Instructions (Addendum)
Rigid sole shoes Injected foot See you next appointment

## 2022-12-16 NOTE — Progress Notes (Signed)
[name] [address] [place] [address] Phone: [phone] Subjective:   [name], am serving as a scribe for Dr. Antoine Primas.  I'm seeing this patient by the request  of:  Langley Gauss, MD  CC: Right ankle pain  [id number]  Kyle [name] is a 52 y.o. male coming in with complaint of foot pain. Patient states that his pain is worsening. No pain while seated. Weight bearing increases his pain.  Patient states that it is all on the medial aspect of the foot and goes and seems to radiate to his heel area.  Patient states it does seem to get worse with activity.      Past Medical History:  Diagnosis Date   Allergy    Arthritis    Asthma    Cataract    small right eye    Constipation    Diverticulosis    Family history of colonic polyps    dad and sister    GERD (gastroesophageal reflux disease)    Neuromuscular disorder (HCC)    spine   Neuropathy of foot    right and mild   Past Surgical History:  Procedure Laterality Date   ANTERIOR CRUCIATE LIGAMENT REPAIR     COLONOSCOPY     EXTERNAL EAR SURGERY     KNEE ARTHROSCOPY     lasik     Social History   Socioeconomic History   Marital status: Single    Spouse name: Not on file   Number of children: Not on file   Years of education: Not on file   Highest education level: Not on file  Occupational History   Not on file  Tobacco Use   Smoking status: Former   Smokeless tobacco: Never  Substance and Sexual Activity   Alcohol use: Yes    Comment: socially    Drug use: Never   Sexual activity: Not on file  Other Topics Concern   Not on file  Social History Narrative   Not on file   Social Determinants of Health   Financial Resource Strain: Low Risk  (06/02/2022)   Received from [facility], [facility]   Overall Financial Resource Strain (CARDIA)    Difficulty of Paying Living Expenses: Not hard at all  Food Insecurity: No Food Insecurity  (06/02/2022)   Received from [facility], [facility]   Hunger Vital Sign    Worried About Running Out of Food in the Last Year: Never true    Ran Out of Food in the Last Year: Never true  Transportation Needs: No Transportation Needs (06/02/2022)   Received from [facility], [facility]   PRAPARE - Transportation    Lack of Transportation (Medical): No    Lack of Transportation (Non-Medical): No  Physical Activity: Unknown (06/02/2022)   Received from [facility], [facility]   Exercise Vital Sign    Days of Exercise per Week: 0 days    Minutes of Exercise per Session: Not on file  Stress: Stress Concern Present (06/02/2022)   Received from [facility], [facility] of Occupational Health - Occupational Stress Questionnaire    Feeling of Stress : To some extent  Social Connections: Socially Integrated (06/02/2022)   Received from [facility], [facility]   Social Network    How would you rate your social network (family, work, friends)?: Good participation with social networks   Allergies  Allergen Reactions   Azithromycin Other (  See Comments)    Possible elevated LFTs Elevated liver enzymes Possible elevated LFTs    Cyclosporine Hives   Meperidine Hives   Levaquin [Levofloxacin]     Muscle pains   Penicillins Swelling    Neck swelling    Family History  Problem Relation Age of Onset   Colon polyps Father    Colon polyps Sister    Colon cancer Paternal Aunt    Colon cancer Cousin    Heart disease Neg Hx    Rectal cancer Neg Hx    Stomach cancer Neg Hx     Current Outpatient Medications (Endocrine & Metabolic):    predniSONE (DELTASONE) 50 MG tablet, Take one tablet daily for the next 7 days.   Current Outpatient Medications (Respiratory):    albuterol (VENTOLIN HFA) 108 (90 Base) MCG/ACT inhaler, Inhale into the lungs.   cetirizine (ZYRTEC) 10 MG tablet, Take by mouth.  Current Outpatient Medications (Analgesics):     HYDROcodone-acetaminophen (NORCO/VICODIN) 5-325 MG tablet, Take 1 tablet by mouth every 12 (twelve) hours as needed for moderate pain.   colchicine 0.6 MG tablet, Take 1 tablet (0.6 mg total) by mouth daily.  Current Outpatient Medications (Hematological):    Cyanocobalamin (VITAMIN B 12 PO), Inject as directed. Every 10 days  Current Outpatient Medications (Other):    CRANBERRY PO, Take by mouth.   cyclobenzaprine (FLEXERIL) 5 MG tablet, Take 1 tablet (5 mg total) by mouth every 8 (eight) hours as needed for muscle spasms.   dicyclomine (BENTYL) 10 MG capsule, Take 2 capsules (20 mg total) by mouth 4 (four) times daily as needed for spasms.   gabapentin (NEURONTIN) 100 MG capsule, Take 2 capsules (200 mg total) by mouth 2 (two) times daily.   Magnesium Oxide 400 MG CAPS, Take by mouth.   Omega-3 1000 MG CAPS, Take by mouth.   ondansetron (ZOFRAN) 4 MG tablet, Take 1 tablet (4 mg total) by mouth every 8 (eight) hours as needed for nausea or vomiting.   pantoprazole (PROTONIX) 40 MG tablet, TAKE 1 TABLET BY MOUTH DAILY AT LEAST 30 MINUTES BEFORE FIRST MEAL OF THE DAY   UNABLE TO FIND, daily. Med Name: Beet Root   valACYclovir (VALTREX) 1000 MG tablet, Take 1 tablet (1,000 mg total) by mouth 2 (two) times daily.   phentermine 37.5 MG capsule, Take by mouth.   UNABLE TO FIND, daily. Med Name: Miralax OTC   Reviewed prior external information including notes and imaging from  primary care provider As well as notes that were available from care everywhere and other healthcare systems.  Past medical history, social, surgical and family history all reviewed in electronic medical record.  No pertanent information unless stated regarding to the chief complaint.   Review of Systems:  No headache, visual changes, nausea, vomiting, diarrhea, constipation, dizziness, abdominal pain, skin rash, fevers, chills, night sweats, weight loss, swollen lymph nodes, body aches, joint swelling, chest pain,  shortness of breath, mood changes. POSITIVE muscle aches  Objective  Blood pressure 118/82, height 6\' 1"  (1.854 m), weight 243 lb (110.2 kg).   General: No apparent distress alert and oriented x3 mood and affect normal, dressed appropriately.  HEENT: Pupils equal, extraocular movements intact  Respiratory: Patient's speak in full sentences and does not appear short of breath  Cardiovascular: No lower extremity edema, non tender, no erythema  Antalgic gait noted.  Right ankle positive Tinel's sign at the tarsal tunnel.  Does have some swelling in the area.  Some limited dorsiflexion of  the foot noted.  No worsening pain with eversion of the foot.  Procedure: Real-time Ultrasound Guided Injection of right ankle tarsal tunnel Device: GE Logiq Q7 Ultrasound guided injection is preferred based studies that show increased duration, increased effect, greater accuracy, decreased procedural pain, increased response rate, and decreased cost with ultrasound guided versus blind injection.  Verbal informed consent obtained.  Time-out conducted.  Noted no overlying erythema, induration, or other signs of local infection.  Skin prepped in a sterile fashion.  Local anesthesia: Topical Ethyl chloride.  With sterile technique and under real time ultrasound guidance: With a 25-gauge half inch needle injected with 0.5 cc of 0.5% Marcaine and 0.5 cc of Kenalog 40 mg/mL Completed without difficulty  Pain immediately resolved suggesting accurate placement of the medication.  Advised to call if fevers/chills, erythema, induration, drainage, or persistent bleeding.  Impression: Technically successful ultrasound guided injection.    Impression and Recommendations:    The above documentation has been reviewed and is accurate and complete Judi Saa, DO

## 2023-01-06 ENCOUNTER — Telehealth: Payer: Self-pay | Admitting: Pharmacy Technician

## 2023-01-06 NOTE — Telephone Encounter (Signed)
Pharmacy Patient Advocate Encounter   Received notification from CoverMyMeds that prior authorization for PANTOPRAZOLE 40MG  is required/requested.   Insurance verification completed.   The patient is insured through [facility] .   Per test claim: PA required; PA submitted to [employer] via CoverMyMeds Key/confirmation #/EOC [facility] Status is pending

## 2023-01-16 ENCOUNTER — Other Ambulatory Visit (HOSPITAL_COMMUNITY): Payer: Self-pay

## 2023-01-16 NOTE — Telephone Encounter (Signed)
Pharmacy Patient Advocate Encounter  Received notification from [facility] that Prior Authorization for Pantoprazole Sodium 40MG  dr tablets has been APPROVED from 01/06/2023 to 01/06/2024. Ran test claim, Copay is $27.25 for a 3 month supply. This test claim was processed through [facility]- copay amounts may vary at other pharmacies due to pharmacy/plan contracts, or as the patient moves through the different stages of their insurance plan.   PA #/Case ID/Reference #: [id number]

## 2023-01-21 ENCOUNTER — Ambulatory Visit: Payer: BC Managed Care – PPO | Admitting: Family Medicine

## 2023-01-22 NOTE — Progress Notes (Signed)
[name] [address] [place] [address] Phone: [phone] Subjective:    I'm seeing this patient by the request  of:  Langley Gauss, MD  CC: Right ankle pain and buttocks pain  [name]  12/16/2022 New problem.  Patient given injection and tolerated the procedure well, discussed icing regimen and home exercises, discussed which activities to do and which ones to avoid.  Increase activity slowly otherwise.  Follow-up with me again in 6 to 8 weeks   Update 01/27/2023 Kyle [name] is a 52 y.o. male coming in with complaint of R tarsal tunnel syndrome. Worsening ankle pain that is burning in nature distal to medial malleolus. No new injury but he does recall past injury where he fell through a sheet of plastic from 2 feet in the air causing him to land on ankle. Ankle was in neutral position upon impact. Denies any inversion or eversion of joint. Pain worse with weight bearing and patient had to hop yesterday to ambulate.       Past Medical History:  Diagnosis Date   Allergy    Arthritis    Asthma    Cataract    small right eye    Constipation    Diverticulosis    Family history of colonic polyps    dad and sister    GERD (gastroesophageal reflux disease)    Neuromuscular disorder (HCC)    spine   Neuropathy of foot    right and mild   Past Surgical History:  Procedure Laterality Date   ANTERIOR CRUCIATE LIGAMENT REPAIR     COLONOSCOPY     EXTERNAL EAR SURGERY     KNEE ARTHROSCOPY     lasik     Social History   Socioeconomic History   Marital status: Single    Spouse name: Not on file   Number of children: Not on file   Years of education: Not on file   Highest education level: Not on file  Occupational History   Not on file  Tobacco Use   Smoking status: Former   Smokeless tobacco: Never  Substance and Sexual Activity   Alcohol use: Yes    Comment: socially    Drug use: Never   Sexual activity: Not on  file  Other Topics Concern   Not on file  Social History Narrative   Not on file   Social Determinants of Health   Financial Resource Strain: Low Risk  (06/02/2022)   Received from [facility], [facility]   Overall Financial Resource Strain (CARDIA)    Difficulty of Paying Living Expenses: Not hard at all  Food Insecurity: No Food Insecurity (06/02/2022)   Received from [facility], [facility]   Hunger Vital Sign    Worried About Running Out of Food in the Last Year: Never true    Ran Out of Food in the Last Year: Never true  Transportation Needs: No Transportation Needs (06/02/2022)   Received from [facility], [facility]   PRAPARE - Transportation    Lack of Transportation (Medical): No    Lack of Transportation (Non-Medical): No  Physical Activity: Unknown (06/02/2022)   Received from [facility], [facility]   Exercise Vital Sign    Days of Exercise per Week: 0 days    Minutes of Exercise per Session: Not on file  Stress: Stress Concern Present (06/02/2022)   Received from [facility], [facility] of Occupational Health - Occupational  Stress Questionnaire    Feeling of Stress : To some extent  Social Connections: Socially Integrated (06/02/2022)   Received from [facility], [facility]   Social Network    How would you rate your social network (family, work, friends)?: Good participation with social networks   Allergies  Allergen Reactions   Azithromycin Other (See Comments)    Possible elevated LFTs Elevated liver enzymes Possible elevated LFTs    Cyclosporine Hives   Meperidine Hives   Levaquin [Levofloxacin]     Muscle pains   Penicillins Swelling    Neck swelling    Family History  Problem Relation Age of Onset   Colon polyps Father    Colon polyps Sister    Colon cancer Paternal Aunt    Colon cancer Cousin    Heart disease Neg Hx    Rectal cancer Neg Hx    Stomach cancer Neg Hx     Current Outpatient  Medications (Endocrine & Metabolic):    predniSONE (DELTASONE) 50 MG tablet, Take one tablet daily for the next 7 days.   Current Outpatient Medications (Respiratory):    albuterol (VENTOLIN HFA) 108 (90 Base) MCG/ACT inhaler, Inhale into the lungs.   cetirizine (ZYRTEC) 10 MG tablet, Take by mouth.  Current Outpatient Medications (Analgesics):    HYDROcodone-acetaminophen (NORCO/VICODIN) 5-325 MG tablet, Take 1 tablet by mouth every 12 (twelve) hours as needed for moderate pain.  Current Outpatient Medications (Hematological):    Cyanocobalamin (VITAMIN B 12 PO), Inject as directed. Every 10 days  Current Outpatient Medications (Other):    CRANBERRY PO, Take by mouth.   cyclobenzaprine (FLEXERIL) 5 MG tablet, Take 1 tablet (5 mg total) by mouth every 8 (eight) hours as needed for muscle spasms.   dicyclomine (BENTYL) 10 MG capsule, Take 2 capsules (20 mg total) by mouth 4 (four) times daily as needed for spasms.   gabapentin (NEURONTIN) 100 MG capsule, Take 2 capsules (200 mg total) by mouth 2 (two) times daily.   Magnesium Oxide 400 MG CAPS, Take by mouth.   Omega-3 1000 MG CAPS, Take by mouth.   ondansetron (ZOFRAN) 4 MG tablet, Take 1 tablet (4 mg total) by mouth every 8 (eight) hours as needed for nausea or vomiting.   pantoprazole (PROTONIX) 40 MG tablet, TAKE 1 TABLET BY MOUTH DAILY AT LEAST 30 MINUTES BEFORE FIRST MEAL OF THE DAY   UNABLE TO FIND, daily. Med Name: Beet Root   valACYclovir (VALTREX) 1000 MG tablet, Take 1 tablet (1,000 mg total) by mouth 2 (two) times daily.   Reviewed prior external information including notes and imaging from  primary care provider As well as notes that were available from care everywhere and other healthcare systems.  Past medical history, social, surgical and family history all reviewed in electronic medical record.  No pertanent information unless stated regarding to the chief complaint.   Review of Systems:  No headache, visual changes,  nausea, vomiting, diarrhea, constipation, dizziness, abdominal pain, skin rash, fevers, chills, night sweats, weight loss, swollen lymph nodes, body aches, joint swelling, chest pain, shortness of breath, mood changes. POSITIVE muscle aches  Objective  Blood pressure 128/86, pulse 68, height 6\' 1"  (1.854 m), weight 244 lb (110.7 kg), SpO2 98%.   General: No apparent distress alert and oriented x3 mood and affect normal, dressed appropriately.  HEENT: Pupils equal, extraocular movements intact  Respiratory: Patient's speak in full sentences and does not appear short of breath  Cardiovascular: No lower extremity edema, non tender, no  erythema  Right ankle does have some tenderness to palpation more over the tarsal tunnel.  Positive Tinel's noted today in this area.  The patient's ankle does have some mild limited range of motion in certain ranges. Low back does have some loss of lordosis.  Tightness noted over the piriformis on the right side.  Osteopathic findings C2 flexed rotated and side bent right C4 flexed rotated and side bent left C6 flexed rotated and side bent left T3 extended rotated and side bent right inhaled third rib T9 extended rotated and side bent left L2 flexed rotated and side bent right Sacrum right on right  Procedure: Real-time Ultrasound Guided Injection of right tarsal tunnel Device: GE Logiq Q7 Ultrasound guided injection is preferred based studies that show increased duration, increased effect, greater accuracy, decreased procedural pain, increased response rate, and decreased cost with ultrasound guided versus blind injection.  Verbal informed consent obtained.  Time-out conducted.  Noted no overlying erythema, induration, or other signs of local infection.  Skin prepped in a sterile fashion.  Local anesthesia: Topical Ethyl chloride.  With sterile technique and under real time ultrasound guidance: With a 25-gauge half inch needle injected with 0.5 cc of 0.5%  Marcaine and 0.5 cc of Kenalog 40 mg/mL. Completed without difficulty  Pain immediately resolved suggesting accurate placement of the medication.  Advised to call if fevers/chills, erythema, induration, drainage, or persistent bleeding.  Impression: Technically successful ultrasound guided injection.  Procedure: Real-time Ultrasound Guided Injection of right piriformis tendon sheath Device: GE Logiq Q7 Ultrasound guided injection is preferred based studies that show increased duration, increased effect, greater accuracy, decreased procedural pain, increased response rate, and decreased cost with ultrasound guided versus blind injection.  Verbal informed consent obtained.  Time-out conducted.  Noted no overlying erythema, induration, or other signs of local infection.  Skin prepped in a sterile fashion.  Local anesthesia: Topical Ethyl chloride.  With sterile technique and under real time ultrasound guidance: With a 22-gauge 2 inch needle injected with  cc of 0.5% Marcaine and 1 cc of Kenalog 40 mg/mL Completed without difficulty  Pain immediately resolved suggesting accurate placement of the medication.  Advised to call if fevers/chills, erythema, induration, drainage, or persistent bleeding.  Impression: Technically successful ultrasound guided injection.   Impression and Recommendations:     The above documentation has been reviewed and is accurate and complete Judi Saa, DO

## 2023-01-27 ENCOUNTER — Other Ambulatory Visit: Payer: Self-pay

## 2023-01-27 ENCOUNTER — Encounter: Payer: Self-pay | Admitting: Family Medicine

## 2023-01-27 ENCOUNTER — Ambulatory Visit: Payer: BC Managed Care – PPO | Admitting: Family Medicine

## 2023-01-27 ENCOUNTER — Ambulatory Visit (INDEPENDENT_AMBULATORY_CARE_PROVIDER_SITE_OTHER): Payer: BC Managed Care – PPO

## 2023-01-27 VITALS — BP 128/86 | HR 68 | Ht 73.0 in | Wt 244.0 lb

## 2023-01-27 DIAGNOSIS — M9901 Segmental and somatic dysfunction of cervical region: Secondary | ICD-10-CM | POA: Diagnosis not present

## 2023-01-27 DIAGNOSIS — M9902 Segmental and somatic dysfunction of thoracic region: Secondary | ICD-10-CM

## 2023-01-27 DIAGNOSIS — M9903 Segmental and somatic dysfunction of lumbar region: Secondary | ICD-10-CM | POA: Diagnosis not present

## 2023-01-27 DIAGNOSIS — M25571 Pain in right ankle and joints of right foot: Secondary | ICD-10-CM

## 2023-01-27 DIAGNOSIS — G5751 Tarsal tunnel syndrome, right lower limb: Secondary | ICD-10-CM

## 2023-01-27 DIAGNOSIS — G8929 Other chronic pain: Secondary | ICD-10-CM | POA: Diagnosis not present

## 2023-01-27 DIAGNOSIS — M9908 Segmental and somatic dysfunction of rib cage: Secondary | ICD-10-CM | POA: Diagnosis not present

## 2023-01-27 DIAGNOSIS — G5701 Lesion of sciatic nerve, right lower limb: Secondary | ICD-10-CM

## 2023-01-27 DIAGNOSIS — M9904 Segmental and somatic dysfunction of sacral region: Secondary | ICD-10-CM

## 2023-01-27 NOTE — Patient Instructions (Addendum)
Injected piriformis and ankle See me in 6-8 weeks

## 2023-01-27 NOTE — Assessment & Plan Note (Signed)
Repeat injection given, chronic discomfort noted.  Discussed with patient about icing regimen and home exercises.  Did more of a tarsal tunnel that seem to be more distal on the foot with new x-rays noted.  Discussed with him that if depending on how patient responds to this will need to consider the possibility of advanced imaging in the MRI.

## 2023-01-27 NOTE — Assessment & Plan Note (Signed)
Has responded to these injections previously.  Still concerned that some of it could be coming from the lumbar spine.  Does respond well to osteopathic manipulation as well.  Discussed posture and ergonomics, increase activity slowly.  Follow-up again in 6 to 8 weeks otherwise

## 2023-02-20 NOTE — Progress Notes (Unsigned)
[name] [address] [place] [address] Phone: [phone] Subjective:   [name], am serving as a scribe for Dr. Antoine Primas.  I'm seeing this patient by the request  of:  Langley Gauss, MD  CC: Back and neck pain follow-up  [id number]  DEMETRIS WHITESELL is a 52 y.o. male coming in with complaint of back and neck pain. OMT 01/27/2023. Patient states that his ankle pain is improving.   C/o R shoulder pain after a fall. Pain in anterior aspect that radiates into bicep. Pain has improved.   Medications patient has been prescribed: None  Taking:         Reviewed prior external information including notes and imaging from previsou exam, outside providers and external EMR if available.   As well as notes that were available from care everywhere and other healthcare systems.  Past medical history, social, surgical and family history all reviewed in electronic medical record.  No pertanent information unless stated regarding to the chief complaint.   Past Medical History:  Diagnosis Date   Allergy    Arthritis    Asthma    Cataract    small right eye    Constipation    Diverticulosis    Family history of colonic polyps    dad and sister    GERD (gastroesophageal reflux disease)    Neuromuscular disorder (HCC)    spine   Neuropathy of foot    right and mild    Allergies  Allergen Reactions   Azithromycin Other (See Comments)    Possible elevated LFTs Elevated liver enzymes Possible elevated LFTs    Cyclosporine Hives   Meperidine Hives   Levaquin [Levofloxacin]     Muscle pains   Penicillins Swelling    Neck swelling      Review of Systems:  No headache, visual changes, nausea, vomiting, diarrhea, constipation, dizziness, abdominal pain, skin rash, fevers, chills, night sweats, weight loss, swollen lymph nodes, body aches, joint swelling, chest pain, shortness of breath, mood changes. POSITIVE muscle  aches  Objective  Blood pressure 114/88, pulse 65, height 6\' 1"  (1.854 m), weight 242 lb (109.8 kg), SpO2 98%.   General: No apparent distress alert and oriented x3 mood and affect normal, dressed appropriately.  HEENT: Pupils equal, extraocular movements intact  Respiratory: Patient's speak in full sentences and does not appear short of breath  Cardiovascular: No lower extremity edema, non tender, no erythema  Gait antalgic gait noted. MSK:  Back back exam does have some loss lordosis noted.  Some tightness noted in the paraspinal musculature in the piriformis on the right greater than left today.  Positive tenderness in the paraspinal musculature of the lumbar spine.  Right ankle pain left ankle does have some arthritic changes noted.  Some mild crepitus with certain range of motion.  Right shoulder positive impingement with Neer and Hawkins.  Negative empty can  Osteopathic findings  C2 flexed rotated and side bent right C5 flexed rotated and side bent left T3 extended rotated and side bent right inhaled rib T9 extended rotated and side bent left L2 flexed rotated and side bent right L4 flexed rotated and side bent left Sacrum right on right   Limited muscular skeletal ultrasound was performed and interpreted by Antoine Primas, M   Right shoulder exam does have some hypoechoic changes that is consistent with more of a bursitis noted.  Rotator cuff appears to be intact.  Mild hypoechoic changes  of the subsubscapularis tendon consistent with a bursitis and tendinitis.    Assessment and Plan:  Tarsal tunnel syndrome, right Does seem to have improvement noted.  Will continue to monitor.  Does have some difficulty with the subtalar joint bilaterally but will continue to follow-up on.  Follow-up with me again in 6 to 8 weeks otherwise.  Lumbar radiculopathy Patient has had this intermittently for quite some time.  Has responded to piriformis injections and sometimes the epidurals  in the back.  Patient feels though that the manipulation has been the most helpful at the moment.  Has had some increasing in stress recently as well.  Will continue to monitor.  Follow-up with me again in 6 to 8 weeks.    Nonallopathic problems  Decision today to treat with OMT was based on Physical Exam  After verbal consent patient was treated with HVLA, ME, FPR techniques in cervical, rib, thoracic, lumbar, and sacral  areas  Patient tolerated the procedure well with improvement in symptoms  Patient given exercises, stretches and lifestyle modifications  See medications in patient instructions if given  Patient will follow up in 4-8 weeks     The above documentation has been reviewed and is accurate and complete Judi Saa, DO         Note: This dictation was prepared with Dragon dictation along with smaller phrase technology. Any transcriptional errors that result from this process are unintentional.

## 2023-02-24 ENCOUNTER — Ambulatory Visit (INDEPENDENT_AMBULATORY_CARE_PROVIDER_SITE_OTHER): Payer: BC Managed Care – PPO | Admitting: Family Medicine

## 2023-02-24 ENCOUNTER — Other Ambulatory Visit: Payer: Self-pay

## 2023-02-24 ENCOUNTER — Encounter: Payer: Self-pay | Admitting: Family Medicine

## 2023-02-24 VITALS — BP 114/88 | HR 65 | Ht 73.0 in | Wt 242.0 lb

## 2023-02-24 DIAGNOSIS — M25571 Pain in right ankle and joints of right foot: Secondary | ICD-10-CM | POA: Diagnosis not present

## 2023-02-24 DIAGNOSIS — M9904 Segmental and somatic dysfunction of sacral region: Secondary | ICD-10-CM

## 2023-02-24 DIAGNOSIS — M7551 Bursitis of right shoulder: Secondary | ICD-10-CM | POA: Insufficient documentation

## 2023-02-24 DIAGNOSIS — M9902 Segmental and somatic dysfunction of thoracic region: Secondary | ICD-10-CM | POA: Diagnosis not present

## 2023-02-24 DIAGNOSIS — M9908 Segmental and somatic dysfunction of rib cage: Secondary | ICD-10-CM

## 2023-02-24 DIAGNOSIS — M5416 Radiculopathy, lumbar region: Secondary | ICD-10-CM | POA: Diagnosis not present

## 2023-02-24 DIAGNOSIS — M9903 Segmental and somatic dysfunction of lumbar region: Secondary | ICD-10-CM

## 2023-02-24 DIAGNOSIS — G5751 Tarsal tunnel syndrome, right lower limb: Secondary | ICD-10-CM

## 2023-02-24 DIAGNOSIS — M9901 Segmental and somatic dysfunction of cervical region: Secondary | ICD-10-CM

## 2023-02-24 NOTE — Patient Instructions (Signed)
Exercises Hands in peripheral vision See me in 6-8 weeks

## 2023-02-24 NOTE — Assessment & Plan Note (Signed)
Acute right shoulder bursitis with what appears to be a chronic irritation of the acromioclavicular joint.  Does seem to be well-healed over the course of many years on that.  Discussed with patient about icing regimen, ergonomics, home exercises.  Worsening pain will consider injection.  Follow-up again in 6 to 8 weeks otherwise.

## 2023-02-24 NOTE — Assessment & Plan Note (Signed)
Does seem to have improvement noted.  Will continue to monitor.  Does have some difficulty with the subtalar joint bilaterally but will continue to follow-up on.  Follow-up with me again in 6 to 8 weeks otherwise.

## 2023-02-24 NOTE — Assessment & Plan Note (Signed)
Patient has had this intermittently for quite some time.  Has responded to piriformis injections and sometimes the epidurals in the back.  Patient feels though that the manipulation has been the most helpful at the moment.  Has had some increasing in stress recently as well.  Will continue to monitor.  Follow-up with me again in 6 to 8 weeks.

## 2023-03-20 ENCOUNTER — Other Ambulatory Visit: Payer: Self-pay | Admitting: Internal Medicine

## 2023-03-20 DIAGNOSIS — R1013 Epigastric pain: Secondary | ICD-10-CM

## 2023-04-16 ENCOUNTER — Ambulatory Visit: Payer: BC Managed Care – PPO | Admitting: Family Medicine

## 2023-04-17 NOTE — Progress Notes (Signed)
 [facility] [address] [place] [address] Phone: [phone] Subjective:   Kyle [name], am serving as a scribe for Dr. Ronnell Coins.  I'm seeing this patient by the request  of:  Judge Notice, MD  CC: Multiple complaints  [id number]  Kyle [name] is a 53 y.o. male coming in with complaint of back and neck pain. OMT 02/24/2023. Also f/u for R ankle pain.  Patient was found to have more of a tarsal tunnel syndrome.  Also had some subtalar joint arthropathy that likely contributed to some of the discomfort and pain but does usually respond fairly well to osteopathic manipulation.  Patient was also having some right shoulder pain and seems to have more of a acute bursitis.  Patient states that he is doing ok.    Medications patient has been prescribed: None  Taking:    Patient is scheduled for surgery February 27.     Reviewed prior external information including notes and imaging from previsou exam, outside providers and external EMR if available.   As well as notes that were available from care everywhere and other healthcare systems.  Past medical history, social, surgical and family history all reviewed in electronic medical record.  No pertanent information unless stated regarding to the chief complaint.   Past Medical History:  Diagnosis Date   Allergy    Arthritis    Asthma    Cataract    small right eye    Constipation    Diverticulosis    Family history of colonic polyps    dad and sister    GERD (gastroesophageal reflux disease)    Neuromuscular disorder (HCC)    spine   Neuropathy of foot    right and mild    Allergies  Allergen Reactions   Azithromycin Other (See Comments)    Possible elevated LFTs Elevated liver enzymes Possible elevated LFTs    Cyclosporine Hives   Meperidine Hives   Levaquin  [Levofloxacin ]     Muscle pains   Penicillins Swelling    Neck swelling      Review of  Systems:  No headache, visual changes, nausea, vomiting, diarrhea, constipation, dizziness, abdominal pain, skin rash, fevers, chills, night sweats, weight loss, swollen lymph nodes, body aches, joint swelling, chest pain, shortness of breath, mood changes. POSITIVE muscle aches  Objective  Blood pressure 118/82, pulse (!) 55, height 6\' 1"  (1.854 m), weight 236 lb (107 kg), SpO2 97%.   General: No apparent distress alert and oriented x3 mood and affect normal, dressed appropriately.  HEENT: Pupils equal, extraocular movements intact  Respiratory: Patient's speak in full sentences and does not appear short of breath  Cardiovascular: No lower extremity edema, non tender, no erythema  MSK:  Back   Osteopathic findings  C3 flexed rotated and side bent right C5 flexed rotated and side bent left T3 extended rotated and side bent right inhaled rib T9 extended rotated and side bent left L2 flexed rotated and side bent right L3 flexed rotated and side bent left Sacrum right on right     Assessment and Plan:  Piriformis syndrome, right Continues to be intermittently painful.  Does have the subtalar joint where patient does have some difficulty.  Discussed icing regimen and home exercises.  Patient will be having a surgical intervention in the relatively near future so we will continue to monitor.  Would like patient to follow-up again in 4 to 6 weeks in case the surgery does  cause some exacerbation.    Nonallopathic problems  Decision today to treat with OMT was based on Physical Exam  After verbal consent patient was treated with HVLA, ME, FPR techniques in cervical, rib, thoracic, lumbar, and sacral  areas  Patient tolerated the procedure well with improvement in symptoms  Patient given exercises, stretches and lifestyle modifications  See medications in patient instructions if given  Patient will follow up in 4-8 weeks     The above documentation has been reviewed and is  accurate and complete [name], DO         Note: This dictation was prepared with Dragon dictation along with smaller phrase technology. Any transcriptional errors that result from this process are unintentional.

## 2023-04-20 ENCOUNTER — Other Ambulatory Visit: Payer: Self-pay

## 2023-04-20 ENCOUNTER — Ambulatory Visit: Payer: BC Managed Care – PPO | Admitting: Family Medicine

## 2023-04-20 ENCOUNTER — Encounter: Payer: Self-pay | Admitting: Family Medicine

## 2023-04-20 VITALS — BP 118/82 | HR 55 | Ht 73.0 in | Wt 236.0 lb

## 2023-04-20 DIAGNOSIS — M9903 Segmental and somatic dysfunction of lumbar region: Secondary | ICD-10-CM | POA: Diagnosis not present

## 2023-04-20 DIAGNOSIS — M9902 Segmental and somatic dysfunction of thoracic region: Secondary | ICD-10-CM

## 2023-04-20 DIAGNOSIS — M25571 Pain in right ankle and joints of right foot: Secondary | ICD-10-CM

## 2023-04-20 DIAGNOSIS — M9904 Segmental and somatic dysfunction of sacral region: Secondary | ICD-10-CM

## 2023-04-20 DIAGNOSIS — G5701 Lesion of sciatic nerve, right lower limb: Secondary | ICD-10-CM | POA: Diagnosis not present

## 2023-04-20 DIAGNOSIS — M9908 Segmental and somatic dysfunction of rib cage: Secondary | ICD-10-CM | POA: Diagnosis not present

## 2023-04-20 DIAGNOSIS — M9901 Segmental and somatic dysfunction of cervical region: Secondary | ICD-10-CM | POA: Diagnosis not present

## 2023-04-20 MED ORDER — PREDNISONE 50 MG PO TABS: ORAL_TABLET | ORAL | 0 refills | Status: DC

## 2023-04-20 NOTE — Patient Instructions (Signed)
 Rx called in See me in 4-6 weeks

## 2023-04-20 NOTE — Assessment & Plan Note (Addendum)
 Continues to be intermittently painful.  Does have the subtalar joint where patient does have some difficulty.  Discussed icing regimen and home exercises.  Patient will be having a surgical intervention in the relatively near future so we will continue to monitor.  Would like patient to follow-up again in 4 to 6 weeks in case the surgery does cause some exacerbation.  Prednisone  50 mg given.

## 2023-05-01 ENCOUNTER — Other Ambulatory Visit: Payer: Self-pay | Admitting: Internal Medicine

## 2023-05-01 DIAGNOSIS — K219 Gastro-esophageal reflux disease without esophagitis: Secondary | ICD-10-CM

## 2023-05-01 DIAGNOSIS — R1013 Epigastric pain: Secondary | ICD-10-CM

## 2023-05-01 MED ORDER — PANTOPRAZOLE SODIUM 40 MG PO TBEC
40.0000 mg | DELAYED_RELEASE_TABLET | Freq: Two times a day (BID) | ORAL | 3 refills | Status: AC
Start: 2023-05-01 — End: ?

## 2023-06-01 ENCOUNTER — Other Ambulatory Visit: Payer: Self-pay

## 2023-06-01 ENCOUNTER — Encounter: Payer: Self-pay | Admitting: Family Medicine

## 2023-06-01 ENCOUNTER — Ambulatory Visit: Payer: BC Managed Care – PPO | Admitting: Family Medicine

## 2023-06-01 VITALS — BP 118/84 | HR 77 | Ht 73.0 in

## 2023-06-01 DIAGNOSIS — M9903 Segmental and somatic dysfunction of lumbar region: Secondary | ICD-10-CM

## 2023-06-01 DIAGNOSIS — M5416 Radiculopathy, lumbar region: Secondary | ICD-10-CM

## 2023-06-01 DIAGNOSIS — M19019 Primary osteoarthritis, unspecified shoulder: Secondary | ICD-10-CM | POA: Insufficient documentation

## 2023-06-01 DIAGNOSIS — M9902 Segmental and somatic dysfunction of thoracic region: Secondary | ICD-10-CM

## 2023-06-01 DIAGNOSIS — M7661 Achilles tendinitis, right leg: Secondary | ICD-10-CM

## 2023-06-01 DIAGNOSIS — M9904 Segmental and somatic dysfunction of sacral region: Secondary | ICD-10-CM | POA: Diagnosis not present

## 2023-06-01 DIAGNOSIS — M79671 Pain in right foot: Secondary | ICD-10-CM | POA: Diagnosis not present

## 2023-06-01 DIAGNOSIS — M9901 Segmental and somatic dysfunction of cervical region: Secondary | ICD-10-CM | POA: Diagnosis not present

## 2023-06-01 DIAGNOSIS — M9908 Segmental and somatic dysfunction of rib cage: Secondary | ICD-10-CM

## 2023-06-01 DIAGNOSIS — M19011 Primary osteoarthritis, right shoulder: Secondary | ICD-10-CM | POA: Diagnosis not present

## 2023-06-01 MED ORDER — PREDNISONE 20 MG PO TABS: 40.0000 mg | ORAL_TABLET | Freq: Every day | ORAL | 0 refills | Status: DC

## 2023-06-01 NOTE — Assessment & Plan Note (Signed)
 Achilles tendinitis on the right side noted.  Discussed warm icing regimen, home exercises, which activities to do and which ones to avoid.  Increase activity slowly otherwise.  Follow-up again in 6 to 8 weeks

## 2023-06-01 NOTE — Progress Notes (Signed)
 [name] [address] [place] [address] Phone: [phone] Subjective:   Kyle [name], am serving as a scribe for Dr. Antoine Primas.  I'm seeing this patient by the request  of:  Langley Gauss, MD  CC: Heel pain, shoulder pain, back pain  [id number]  MARCELLOUS SNARSKI is a 53 y.o. male coming in with complaint of back and neck pain. OMT on 04/20/2023. Patient states that his R foot has not ever gotten any better.   R shoulder pain anterior and superior with abduction. Denies any radicular symptoms.   Thoracic spine is tight today.    Medications patient has been prescribed: prednisone  Taking:         Reviewed prior external information including notes and imaging from previsou exam, outside providers and external EMR if available.   As well as notes that were available from care everywhere and other healthcare systems.  Past medical history, social, surgical and family history all reviewed in electronic medical record.  No pertanent information unless stated regarding to the chief complaint.   Past Medical History:  Diagnosis Date   Allergy    Arthritis    Asthma    Cataract    small right eye    Constipation    Diverticulosis    Family history of colonic polyps    dad and sister    GERD (gastroesophageal reflux disease)    Neuromuscular disorder (HCC)    spine   Neuropathy of foot    right and mild    Allergies  Allergen Reactions   Azithromycin Other (See Comments)    Possible elevated LFTs Elevated liver enzymes Possible elevated LFTs    Cyclosporine Hives   Meperidine Hives   Levaquin [Levofloxacin]     Muscle pains   Penicillins Swelling    Neck swelling      Review of Systems:  No headache, visual changes, nausea, vomiting, diarrhea, constipation, dizziness, abdominal pain, skin rash, fevers, chills, night sweats, weight loss, swollen lymph nodes, body aches, joint swelling, chest pain,  shortness of breath, mood changes. POSITIVE muscle aches  Objective  Blood pressure 118/84, pulse 77, height 6\' 1"  (1.854 m), SpO2 100%.   General: No apparent distress alert and oriented x3 mood and affect normal, dressed appropriately.  HEENT: Pupils equal, extraocular movements intact  Respiratory: Patient's speak in full sentences and does not appear short of breath  Cardiovascular: No lower extremity edema, non tender, no erythema  Gait relatively normal MSK:  Back does have some loss lordosis noted.  Some tightness noted in the thoracolumbar juncture but also in the cervical thoracic area.  Neck exam does have some limited sidebending noted.  Osteopathic findings  C6 flexed rotated and side bent left T3 extended rotated and side bent right inhaled rib T9 extended rotated and side bent left T11 extended rotated and side bent left L2 flexed rotated and side bent right Sacrum right on right  Limited muscular skeletal ultrasound was performed and interpreted by Antoine Primas, M  Limited ultrasound shows a patient does have calcific changes noted at the insertion of the Achilles with what appears to be a relatively large bone spur. Patient right shoulder shows that the rotator cuff is intact.  Unfortunately arthritic changes noted of the acromioclavicular joint noted.  Fairly moderate.  Spurring also noted.    Assessment and Plan:  AC (acromioclavicular) arthritis Arthritis noted of the acromioclavicular joint.  Does have some spurring noted.  Patient is thinking of getting the shockwave therapy going.  Do think that this could be beneficial for his injuries.  Patient is agreement with the plan and will try it.  If he decides not to he knows that we can give it to him here for $75 for treatment.  Follow-up again in 6 to 8 weeks otherwise  Achilles tendinitis, right leg Achilles tendinitis on the right side noted.  Discussed warm icing regimen, home exercises, which activities to do  and which ones to avoid.  Increase activity slowly otherwise.  Follow-up again in 6 to 8 weeks    Nonallopathic problems  Decision today to treat with OMT was based on Physical Exam  After verbal consent patient was treated with HVLA, ME, FPR techniques in cervical, rib, thoracic, lumbar, and sacral  areas  Patient tolerated the procedure well with improvement in symptoms  Patient given exercises, stretches and lifestyle modifications  See medications in patient instructions if given  Patient will follow up in 4-8 weeks     The above documentation has been reviewed and is accurate and complete Judi Saa, DO         Note: This dictation was prepared with Dragon dictation along with smaller phrase technology. Any transcriptional errors that result from this process are unintentional.

## 2023-06-01 NOTE — Assessment & Plan Note (Signed)
 NotedNeck pain exacerbation noted.  Given prednisone and will do a 10-day taper may help with some of the other problems.

## 2023-06-01 NOTE — Patient Instructions (Signed)
 Pred 40mg  for 5 days then 20 mg for 5 days Use shockwave Keep using heel lift See me in 6-8 weeks

## 2023-06-01 NOTE — Assessment & Plan Note (Signed)
 Arthritis noted of the acromioclavicular joint.  Does have some spurring noted.  Patient is thinking of getting the shockwave therapy going.  Do think that this could be beneficial for his injuries.  Patient is agreement with the plan and will try it.  If he decides not to he knows that we can give it to him here for $75 for treatment.  Follow-up again in 6 to 8 weeks otherwise

## 2023-06-09 ENCOUNTER — Telehealth: Payer: Self-pay | Admitting: Family Medicine

## 2023-06-09 NOTE — Telephone Encounter (Signed)
 Patient called stating that he followed the directions for the Prednisone that stated to take 40mg  for 7 days but Dr Katrinka Blazing told him to do 40mg  for 5 days then 20mg  for 5 days. He took 40 mg for 7 days and took 20mg  yesterday but now is out of medication He asked if he should taper down more? If so, he will need more sent in.  Please advise.

## 2023-07-24 NOTE — Progress Notes (Deleted)
  [facility] [address] [place] [address] Phone: [phone] Subjective:    I'm seeing this patient by the request  of:  Judge Notice, MD  CC:   [id number]  Kyle [name] is a 53 y.o. male coming in with complaint of back and neck pain. OTM 06/01/2023. R foot and R AC jt pain. Patient states   Medications patient has been prescribed: Prednisone   Taking:         Reviewed prior external information including notes and imaging from previsou exam, outside providers and external EMR if available.   As well as notes that were available from care everywhere and other healthcare systems.  Past medical history, social, surgical and family history all reviewed in electronic medical record.  No pertanent information unless stated regarding to the chief complaint.   Past Medical History:  Diagnosis Date   Allergy    Arthritis    Asthma    Cataract    small right eye    Constipation    Diverticulosis    Family history of colonic polyps    dad and sister    GERD (gastroesophageal reflux disease)    Neuromuscular disorder (HCC)    spine   Neuropathy of foot    right and mild    Allergies  Allergen Reactions   Azithromycin Other (See Comments)    Possible elevated LFTs Elevated liver enzymes Possible elevated LFTs    Cyclosporine Hives   Meperidine Hives   Levaquin  [Levofloxacin ]     Muscle pains   Penicillins Swelling    Neck swelling      Review of Systems:  No headache, visual changes, nausea, vomiting, diarrhea, constipation, dizziness, abdominal pain, skin rash, fevers, chills, night sweats, weight loss, swollen lymph nodes, body aches, joint swelling, chest pain, shortness of breath, mood changes. POSITIVE muscle aches  Objective  There were no vitals taken for this visit.   General: No apparent distress alert and oriented x3 mood and affect normal, dressed appropriately.  HEENT: Pupils equal, extraocular  movements intact  Respiratory: Patient's speak in full sentences and does not appear short of breath  Cardiovascular: No lower extremity edema, non tender, no erythema  Gait MSK:  Back   Osteopathic findings  C2 flexed rotated and side bent right C6 flexed rotated and side bent left T3 extended rotated and side bent right inhaled rib T9 extended rotated and side bent left L2 flexed rotated and side bent right Sacrum right on right       Assessment and Plan:  No problem-specific Assessment & Plan notes found for this encounter.    Nonallopathic problems  Decision today to treat with OMT was based on Physical Exam  After verbal consent patient was treated with HVLA, ME, FPR techniques in cervical, rib, thoracic, lumbar, and sacral  areas  Patient tolerated the procedure well with improvement in symptoms  Patient given exercises, stretches and lifestyle modifications  See medications in patient instructions if given  Patient will follow up in 4-8 weeks             Note: This dictation was prepared with Dragon dictation along with smaller phrase technology. Any transcriptional errors that result from this process are unintentional.

## 2023-07-28 ENCOUNTER — Ambulatory Visit: Admitting: Family Medicine

## 2023-08-09 ENCOUNTER — Encounter: Payer: Self-pay | Admitting: Family Medicine

## 2023-08-10 ENCOUNTER — Other Ambulatory Visit: Payer: Self-pay

## 2023-08-10 MED ORDER — GABAPENTIN 100 MG PO CAPS: 200.0000 mg | ORAL_CAPSULE | Freq: Two times a day (BID) | ORAL | 0 refills | Status: DC

## 2023-08-20 ENCOUNTER — Other Ambulatory Visit: Payer: Self-pay | Admitting: Internal Medicine

## 2023-08-20 DIAGNOSIS — K582 Mixed irritable bowel syndrome: Secondary | ICD-10-CM

## 2023-08-20 MED ORDER — DICYCLOMINE HCL 10 MG PO CAPS: 20.0000 mg | ORAL_CAPSULE | Freq: Four times a day (QID) | ORAL | 3 refills | Status: DC | PRN

## 2023-08-20 NOTE — Progress Notes (Signed)
 Refill bentyl

## 2023-09-01 NOTE — Progress Notes (Signed)
 [name] [address] [place] [address] Phone: [phone] Subjective:   ISusannah [name], am serving as a scribe for Dr. Arthea Claudene.  I'm seeing this patient by the request  of:  Lemon Lamar Flavors, MD  CC: Back and neck pain follow-up  YEP:Dlagzrupcz  Kyle [name] is a 53 y.o. male coming in with complaint of back and neck pain. OMT on 06/01/2023. Patient states doing well. No new symptoms.  Has had some discomfort but nothing severe.  Ankle still give him some trouble but nothing severe.  Feels like he does not need any injections  Medications patient has been prescribed: gabapentin   Taking:         Reviewed prior external information including notes and imaging from previsou exam, outside providers and external EMR if available.   As well as notes that were available from care everywhere and other healthcare systems.  Past medical history, social, surgical and family history all reviewed in electronic medical record.  No pertanent information unless stated regarding to the chief complaint.   Past Medical History:  Diagnosis Date   Allergy    Arthritis    Asthma    Cataract    small right eye    Constipation    Diverticulosis    Family history of colonic polyps    dad and sister    GERD (gastroesophageal reflux disease)    Neuromuscular disorder (HCC)    spine   Neuropathy of foot    right and mild    Allergies  Allergen Reactions   Azithromycin Other (See Comments)    Possible elevated LFTs Elevated liver enzymes Possible elevated LFTs    Cyclosporine Hives   Meperidine Hives   Levaquin  [Levofloxacin ]     Muscle pains   Penicillins Swelling    Neck swelling      Review of Systems:  No headache, visual changes, nausea, vomiting, diarrhea, constipation, dizziness, abdominal pain, skin rash, fevers, chills, night sweats, weight loss, swollen lymph nodes, body aches, joint swelling, chest pain, shortness of  breath, mood changes. POSITIVE muscle aches  Objective  Blood pressure 122/74, pulse 86, height 6' 1 (1.854 m), weight 250 lb (113.4 kg), SpO2 98%.   General: No apparent distress alert and oriented x3 mood and affect normal, dressed appropriately.  HEENT: Pupils equal, extraocular movements intact  Respiratory: Patient's speak in full sentences and does not appear short of breath  Cardiovascular: No lower extremity edema, non tender, no erythema  Gait relatively normal MSK:  Back more tightness noted in the paraspinal musculature of the lumbar spine.  Tightness with Deri right greater than left.  Osteopathic findings  C6 flexed rotated and side bent left T3 extended rotated and side bent right inhaled rib T9 extended rotated and side bent left L2 flexed rotated and side bent right L3 flexed rotated and side bent left Sacrum right on right Ankles do have some limited range of motion.      Assessment and Plan:  Lumbar radiculopathy Continues to have arthritic changes of multiple joints.  Discussed with patient about icing regimen and home exercises, discussed posture and ergonomics.  Increase activity slowly.  Follow-up again in 6 to 8 weeks.    Nonallopathic problems  Decision today to treat with OMT was based on Physical Exam  After verbal consent patient was treated with HVLA, ME, FPR techniques in cervical, rib, thoracic, lumbar, and sacral  areas avoided HVLA on the cervical spine  Patient tolerated the procedure well with improvement in symptoms  Patient given exercises, stretches and lifestyle modifications  See medications in patient instructions if given  Patient will follow up in 4-8 weeks    The above documentation has been reviewed and is accurate and complete Claudell Wohler M Margarett Viti, DO          Note: This dictation was prepared with Dragon dictation along with smaller phrase technology. Any transcriptional errors that result from this process are  unintentional.

## 2023-09-08 ENCOUNTER — Encounter: Payer: Self-pay | Admitting: Family Medicine

## 2023-09-08 ENCOUNTER — Ambulatory Visit: Admitting: Family Medicine

## 2023-09-08 VITALS — BP 122/74 | HR 86 | Ht 73.0 in | Wt 250.0 lb

## 2023-09-08 DIAGNOSIS — M5416 Radiculopathy, lumbar region: Secondary | ICD-10-CM

## 2023-09-08 DIAGNOSIS — M9903 Segmental and somatic dysfunction of lumbar region: Secondary | ICD-10-CM | POA: Diagnosis not present

## 2023-09-08 DIAGNOSIS — M9904 Segmental and somatic dysfunction of sacral region: Secondary | ICD-10-CM | POA: Diagnosis not present

## 2023-09-08 DIAGNOSIS — M9901 Segmental and somatic dysfunction of cervical region: Secondary | ICD-10-CM | POA: Diagnosis not present

## 2023-09-08 DIAGNOSIS — M9902 Segmental and somatic dysfunction of thoracic region: Secondary | ICD-10-CM | POA: Diagnosis not present

## 2023-09-08 DIAGNOSIS — M9908 Segmental and somatic dysfunction of rib cage: Secondary | ICD-10-CM

## 2023-09-08 NOTE — Assessment & Plan Note (Signed)
 Continues to have arthritic changes of multiple joints.  Discussed with patient about icing regimen and home exercises, discussed posture and ergonomics.  Increase activity slowly.  Follow-up again in 6 to 8 weeks.

## 2023-09-08 NOTE — Patient Instructions (Signed)
 Good to see you!  See you again in 8 weeks

## 2023-10-12 ENCOUNTER — Telehealth: Payer: Self-pay | Admitting: *Deleted

## 2023-10-12 ENCOUNTER — Encounter: Payer: Self-pay | Admitting: *Deleted

## 2023-10-12 DIAGNOSIS — Z1211 Encounter for screening for malignant neoplasm of colon: Secondary | ICD-10-CM

## 2023-10-12 DIAGNOSIS — K219 Gastro-esophageal reflux disease without esophagitis: Secondary | ICD-10-CM

## 2023-10-12 MED ORDER — NA SULFATE-K SULFATE-MG SULF 17.5-3.13-1.6 GM/177ML PO SOLN: 1.0000 | Freq: Once | ORAL | 0 refills | Status: AC

## 2023-10-12 NOTE — Telephone Encounter (Signed)
 Previsit done for pt.  Amb gi referral put and prep sent to pharmacy

## 2023-10-22 ENCOUNTER — Encounter: Payer: Self-pay | Admitting: Internal Medicine

## 2023-11-16 NOTE — Progress Notes (Unsigned)
 [name] [address] [place] [address] Phone: [phone] Subjective:   Kyle [name], am serving as a scribe for Dr. Arthea Claudene.  I'm seeing this patient by the request  of:  Kyle Lamar Flavors, MD  CC: Multiple complaints  YEP:Dlagzrupcz  Kyle [name] is a 53 y.o. male coming in with complaint of back and neck pain. OMT 09/08/2023. Patient states that he needs injection in piriformis.   Medications patient has been prescribed: None  Taking:         Reviewed prior external information including notes and imaging from previsou exam, outside providers and external EMR if available.   As well as notes that were available from care everywhere and other healthcare systems.  Past medical history, social, surgical and family history all reviewed in electronic medical record.  No pertanent information unless stated regarding to the chief complaint.   Past Medical History:  Diagnosis Date   Allergy    Arthritis    Asthma    Cataract    small right eye    Constipation    Diverticulosis    Family history of colonic polyps    dad and sister    GERD (gastroesophageal reflux disease)    Neuromuscular disorder (HCC)    spine   Neuropathy of foot    right and mild    Allergies  Allergen Reactions   Penicillins Swelling    Neck swelling    Azithromycin Other (See Comments)    Possible elevated LFTs Elevated liver enzymes Possible elevated LFTs    Cyclosporine Hives   Latex Other (See Comments)    EKG leads make my skin come off   Meperidine Hives   Levaquin  [Levofloxacin ] Other (See Comments)    Muscle pains     Review of Systems:  No headache, visual changes, nausea, vomiting, diarrhea, constipation, dizziness, abdominal pain, skin rash, fevers, chills, night sweats, weight loss, swollen lymph nodes, body aches, joint swelling, chest pain, shortness of breath, mood changes. POSITIVE muscle aches  Objective  Blood  pressure 122/76, pulse 80, height 6' 1 (1.854 m), weight 253 lb (114.8 kg), SpO2 98%.   General: No apparent distress alert and oriented x3 mood and affect normal, dressed appropriately.  HEENT: Pupils equal, extraocular movements intact  Respiratory: Patient's speak in full sentences and does not appear short of breath  Cardiovascular: No lower extremity edema, non tender, no erythema  Gait relatively normal MSK:  Back does have some loss lordosis.  Severe tenderness to palpation in the paraspinal musculature of the lumbar spine bilaterally.  No midline tenderness.  Tightness noted over the right piriformis that is quite severe.  Positive FABER test.  Neck exam some limited sidebending bilaterally.  Osteopathic findings  C2 flexed rotated and side bent right C6 flexed rotated and side bent left C7 flexed rotated and side bent right T3 extended rotated and side bent right inhaled rib T9 extended rotated and side bent left L2 flexed rotated and side bent right Sacrum right on right       Assessment and Plan:  Piriformis syndrome, right Do believe that there is an exacerbation.  Been nearly a year since injection and we do need to consider it again discussed icing regimen and home exercises, continue to monitor other arthritic changes.  No other changes in medication but did refill patient's Zofran .  4 mg to take for nausea when needed pain gets that bad.  Follow-up again in 6  weeks    Nonallopathic problems  Decision today to treat with OMT was based on Physical Exam  After verbal consent patient was treated with HVLA, ME, FPR techniques in cervical, rib, thoracic, lumbar, and sacral  areas avoided HVLA on the cervical spine.  Patient tolerated the procedure well with improvement in symptoms  Patient given exercises, stretches and lifestyle modifications  See medications in patient instructions if given  Patient will follow up in 4-8 weeks     The above documentation has  been reviewed and is accurate and complete [name] M [name], DO         Note: This dictation was prepared with Dragon dictation along with smaller phrase technology. Any transcriptional errors that result from this process are unintentional.

## 2023-11-17 ENCOUNTER — Ambulatory Visit: Admitting: Family Medicine

## 2023-11-17 ENCOUNTER — Encounter: Payer: Self-pay | Admitting: Family Medicine

## 2023-11-17 ENCOUNTER — Ambulatory Visit

## 2023-11-17 VITALS — BP 122/76 | HR 80 | Ht 73.0 in | Wt 253.0 lb

## 2023-11-17 DIAGNOSIS — M9902 Segmental and somatic dysfunction of thoracic region: Secondary | ICD-10-CM

## 2023-11-17 DIAGNOSIS — M9901 Segmental and somatic dysfunction of cervical region: Secondary | ICD-10-CM | POA: Diagnosis not present

## 2023-11-17 DIAGNOSIS — M9903 Segmental and somatic dysfunction of lumbar region: Secondary | ICD-10-CM

## 2023-11-17 DIAGNOSIS — M9904 Segmental and somatic dysfunction of sacral region: Secondary | ICD-10-CM | POA: Diagnosis not present

## 2023-11-17 DIAGNOSIS — G5701 Lesion of sciatic nerve, right lower limb: Secondary | ICD-10-CM

## 2023-11-17 DIAGNOSIS — M9908 Segmental and somatic dysfunction of rib cage: Secondary | ICD-10-CM | POA: Diagnosis not present

## 2023-11-17 DIAGNOSIS — M5416 Radiculopathy, lumbar region: Secondary | ICD-10-CM

## 2023-11-17 MED ORDER — ONDANSETRON HCL 4 MG PO TABS: 4.0000 mg | ORAL_TABLET | Freq: Every day | ORAL | 0 refills | Status: AC | PRN

## 2023-11-17 NOTE — Patient Instructions (Signed)
See you again in 2 months 

## 2023-11-17 NOTE — Assessment & Plan Note (Signed)
 Do believe that there is an exacerbation.  Been nearly a year since injection and we do need to consider it again discussed icing regimen and home exercises, continue to monitor other arthritic changes.  No other changes in medication but did refill patient's Zofran .  4 mg to take for nausea when needed pain gets that bad.  Follow-up again in 6 weeks

## 2023-11-17 NOTE — Assessment & Plan Note (Signed)
 X-rays of the lumbar spine ordered today as well to further evaluate for any bony abnormality that can be contributing.

## 2023-12-15 ENCOUNTER — Ambulatory Visit (INDEPENDENT_AMBULATORY_CARE_PROVIDER_SITE_OTHER)

## 2023-12-15 ENCOUNTER — Telehealth: Payer: Self-pay | Admitting: Family Medicine

## 2023-12-15 ENCOUNTER — Other Ambulatory Visit: Payer: Self-pay

## 2023-12-15 DIAGNOSIS — M546 Pain in thoracic spine: Secondary | ICD-10-CM

## 2023-12-15 DIAGNOSIS — M542 Cervicalgia: Secondary | ICD-10-CM

## 2023-12-15 DIAGNOSIS — M5416 Radiculopathy, lumbar region: Secondary | ICD-10-CM

## 2023-12-15 NOTE — Telephone Encounter (Signed)
 Patient called asking if [name] could add xray orders for thoracic and cervical in addition to what was ordered at his last visit.  Please advise.  (He would like to come in today to have them done)

## 2023-12-17 ENCOUNTER — Encounter: Payer: Self-pay | Admitting: Family Medicine

## 2023-12-20 ENCOUNTER — Ambulatory Visit: Payer: Self-pay | Admitting: Family Medicine

## 2023-12-21 ENCOUNTER — Ambulatory Visit (AMBULATORY_SURGERY_CENTER): Payer: Self-pay | Admitting: Internal Medicine

## 2023-12-21 ENCOUNTER — Encounter: Payer: Self-pay | Admitting: Internal Medicine

## 2023-12-21 VITALS — BP 126/79 | HR 72 | Temp 98.2°F | Resp 13 | Ht 73.0 in | Wt 251.0 lb

## 2023-12-21 DIAGNOSIS — K5730 Diverticulosis of large intestine without perforation or abscess without bleeding: Secondary | ICD-10-CM | POA: Diagnosis not present

## 2023-12-21 DIAGNOSIS — D123 Benign neoplasm of transverse colon: Secondary | ICD-10-CM

## 2023-12-21 DIAGNOSIS — Z8619 Personal history of other infectious and parasitic diseases: Secondary | ICD-10-CM

## 2023-12-21 DIAGNOSIS — Z1211 Encounter for screening for malignant neoplasm of colon: Secondary | ICD-10-CM

## 2023-12-21 DIAGNOSIS — K219 Gastro-esophageal reflux disease without esophagitis: Secondary | ICD-10-CM | POA: Diagnosis not present

## 2023-12-21 DIAGNOSIS — K2900 Acute gastritis without bleeding: Secondary | ICD-10-CM | POA: Diagnosis not present

## 2023-12-21 DIAGNOSIS — Z83719 Family history of colon polyps, unspecified: Secondary | ICD-10-CM

## 2023-12-21 MED ORDER — SODIUM CHLORIDE 0.9 % IV SOLN: 500.0000 mL | Freq: Once | INTRAVENOUS | Status: DC

## 2023-12-21 NOTE — Progress Notes (Signed)
 GASTROENTEROLOGY PROCEDURE H&P NOTE   Primary Care Physician: Lemon Lamar Flavors, MD    Reason for Procedure:  GERD, history of H. pylori gastritis, and history of colon polyps and multiple first-degree relatives of colon cancer and multiple secondary relatives  Plan:    EGD and colonoscopy  Patient is appropriate for endoscopic procedure(s) in the ambulatory ([facility]) setting.  The nature of the procedure, as well as the risks, benefits, and alternatives were carefully and thoroughly reviewed with the patient. Ample time for discussion and questions allowed. The patient understood, was satisfied, and agreed to proceed.     HPI: Kyle [name] is a 53 y.o. male who presents for EGD and colonoscopy.  Medical history as below.  Tolerated the prep.  No recent chest pain or shortness of breath.  No abdominal pain today.  Past Medical History:  Diagnosis Date   Allergy    Arthritis    Asthma    Cataract    small right eye    Constipation    Diverticulosis    Family history of colonic polyps    dad and sister    GERD (gastroesophageal reflux disease)    Neuromuscular disorder (HCC)    spine   Neuropathy of foot    right and mild    Past Surgical History:  Procedure Laterality Date   ANTERIOR CRUCIATE LIGAMENT REPAIR     COLONOSCOPY     EXTERNAL EAR SURGERY     KNEE ARTHROSCOPY     lasik     UPPER GASTROINTESTINAL ENDOSCOPY      Prior to Admission medications   Medication Sig Start Date End Date Taking? Authorizing Provider  acetaminophen  (TYLENOL ) 500 MG tablet Take 500 mg by mouth daily.   Yes [provider]  albuterol (VENTOLIN HFA) 108 (90 Base) MCG/ACT inhaler Inhale into the lungs.   Yes [provider]  allopurinol (ZYLOPRIM) 300 MG tablet Take 300 mg by mouth daily.   Yes [provider]  bismuth  subsalicylate (PEPTO BISMOL) 262 MG chewable tablet Chew 524 mg by mouth as needed.   Yes [provider]  cetirizine (ZYRTEC) 10  MG tablet Take by mouth.   Yes [provider]  colchicine  0.6 MG tablet Take 0.6 mg by mouth daily.   Yes [provider]  CRANBERRY PO Take 30,000 mg by mouth daily.   Yes [provider]  cyclobenzaprine  (FLEXERIL ) 5 MG tablet Take 1 tablet (5 mg total) by mouth every 8 (eight) hours as needed for muscle spasms. 08/05/22  Yes Claudene Arthea HERO, DO  dicyclomine  (BENTYL ) 10 MG capsule Take 20 mg by mouth daily. 05/09/20  Yes [provider]  Docusate Sodium 100 MG capsule Take 100 mg by mouth daily. 03/22/23  Yes [provider]  Fluocinolone Acetonide 0.01 % OIL Place in ear(s) daily. 11/17/23  Yes [provider]  gabapentin  (NEURONTIN ) 100 MG capsule Take 100 mg by mouth as needed. 03/27/17  Yes [provider]  HYDROcodone -acetaminophen  (NORCO/VICODIN) 5-325 MG tablet Take 1 tablet by mouth every 12 (twelve) hours as needed for moderate pain. 03/18/22  Yes Smith, Zachary M, DO  Magnesium Oxide 400 MG CAPS Take by mouth.   Yes [provider]  minoxidil (LONITEN) 2.5 MG tablet Take 2.5 mg by mouth 2 (two) times daily.   Yes [provider]  Misc Natural Products (BEET ROOT PO) Take 1 tablet by mouth daily.   Yes [provider]  montelukast (SINGULAIR) 10 MG  tablet Take 10 mg by mouth daily.   Yes [provider]  naproxen sodium (ALEVE) 220 MG tablet Take 220 mg by mouth daily.   Yes [provider]  pantoprazole  (PROTONIX ) 40 MG tablet Take 1 tablet (40 mg total) by mouth 2 (two) times daily before a meal. 05/01/23  Yes Cy Bresee, Gordy HERO, MD  predniSONE  (DELTASONE ) 50 MG tablet Take 50 mg by mouth daily as needed.   Yes [provider]  saccharomyces boulardii (FLORASTOR) 250 MG capsule Take 250 mg by mouth daily.   Yes [provider]  SAW PALMETTO, SERENOA REPENS, PO Take 500 mg by mouth daily.   Yes [provider]  Tadalafil (CIALIS) 2.5 MG TABS Take 1 tablet by mouth  daily.   Yes [provider]  TART CHERRY PO Take 500 mg by mouth daily. 06/09/22  Yes [provider]  ondansetron  (ZOFRAN ) 4 MG tablet Take 1 tablet (4 mg total) by mouth daily as needed. 11/17/23   Claudene Arthea HERO, DO  valACYclovir  (VALTREX ) 1000 MG tablet Take 1 tablet (1,000 mg total) by mouth 2 (two) times daily. Patient taking differently: Take 1,000 mg by mouth 2 (two) times daily. Takes PRN 02/16/20   Albertus Gordy HERO, MD    Current Outpatient Medications  Medication Sig Dispense Refill   acetaminophen  (TYLENOL ) 500 MG tablet Take 500 mg by mouth daily.     albuterol (VENTOLIN HFA) 108 (90 Base) MCG/ACT inhaler Inhale into the lungs.     allopurinol (ZYLOPRIM) 300 MG tablet Take 300 mg by mouth daily.     bismuth  subsalicylate (PEPTO BISMOL) 262 MG chewable tablet Chew 524 mg by mouth as needed.     cetirizine (ZYRTEC) 10 MG tablet Take by mouth.     colchicine  0.6 MG tablet Take 0.6 mg by mouth daily.     CRANBERRY PO Take 30,000 mg by mouth daily.     cyclobenzaprine  (FLEXERIL ) 5 MG tablet Take 1 tablet (5 mg total) by mouth every 8 (eight) hours as needed for muscle spasms. 90 tablet 0   dicyclomine  (BENTYL ) 10 MG capsule Take 20 mg by mouth daily.     Docusate Sodium 100 MG capsule Take 100 mg by mouth daily.     Fluocinolone Acetonide 0.01 % OIL Place in ear(s) daily.     gabapentin  (NEURONTIN ) 100 MG capsule Take 100 mg by mouth as needed.     HYDROcodone -acetaminophen  (NORCO/VICODIN) 5-325 MG tablet Take 1 tablet by mouth every 12 (twelve) hours as needed for moderate pain. 10 tablet 0   Magnesium Oxide 400 MG CAPS Take by mouth.     minoxidil (LONITEN) 2.5 MG tablet Take 2.5 mg by mouth 2 (two) times daily.     Misc Natural Products (BEET ROOT PO) Take 1 tablet by mouth daily.     montelukast (SINGULAIR) 10 MG tablet Take 10 mg by mouth daily.     naproxen sodium (ALEVE) 220 MG tablet Take 220 mg by mouth daily.     pantoprazole  (PROTONIX ) 40 MG tablet Take 1  tablet (40 mg total) by mouth 2 (two) times daily before a meal. 180 tablet 3   predniSONE  (DELTASONE ) 50 MG tablet Take 50 mg by mouth daily as needed.     saccharomyces boulardii (FLORASTOR) 250 MG capsule Take 250 mg by mouth daily.     SAW PALMETTO, SERENOA REPENS, PO Take 500 mg by mouth daily.     Tadalafil (CIALIS) 2.5 MG TABS Take 1 tablet  by mouth daily.     TART CHERRY PO Take 500 mg by mouth daily.     ondansetron  (ZOFRAN ) 4 MG tablet Take 1 tablet (4 mg total) by mouth daily as needed. 10 tablet 0   valACYclovir  (VALTREX ) 1000 MG tablet Take 1 tablet (1,000 mg total) by mouth 2 (two) times daily. (Patient taking differently: Take 1,000 mg by mouth 2 (two) times daily. Takes PRN) 60 tablet 1   Current Facility-Administered Medications  Medication Dose Route Frequency Provider Last Rate Last Admin   0.9 %  sodium chloride  infusion  500 mL Intravenous Once Aprille Sawhney, Gordy HERO, MD        Allergies as of 12/21/2023 - Review Complete 12/21/2023  Allergen Reaction Noted   Penicillins Swelling 05/02/2011   Azithromycin Other (See Comments) 04/09/2012   Cyclosporine Hives 05/04/2014   Latex Other (See Comments) 11/11/2018   Meperidine Hives 05/02/2011   Levaquin  [levofloxacin ] Other (See Comments) 03/18/2022    Family History  Problem Relation Age of Onset   Colon polyps Father    Colon polyps Sister    Colon cancer Paternal Aunt    Colon cancer Cousin    Heart disease Neg Hx    Rectal cancer Neg Hx    Stomach cancer Neg Hx    Esophageal cancer Neg Hx     Social History   Socioeconomic History   Marital status: Single    Spouse name: Not on file   Number of children: Not on file   Years of education: Not on file   Highest education level: Not on file  Occupational History   Not on file  Tobacco Use   Smoking status: Former   Smokeless tobacco: Never  Vaping Use   Vaping status: Never Used  Substance and Sexual Activity   Alcohol use: Yes    Comment: socially    Drug  use: Never   Sexual activity: Not on file  Other Topics Concern   Not on file  Social History Narrative   Not on file   Social Drivers of Health   Financial Resource Strain: Low Risk  (06/07/2023)   Received from [facility]   Overall Financial Resource Strain (CARDIA)    Difficulty of Paying Living Expenses: Not hard at all  Food Insecurity: No Food Insecurity (06/07/2023)   Received from [facility]   Hunger Vital Sign    Within the past 12 months, you worried that your food would run out before you got the money to buy more.: Never true    Within the past 12 months, the food you bought just didn't last and you didn't have money to get more.: Never true  Transportation Needs: No Transportation Needs (06/07/2023)   Received from [facility] - Transportation    Lack of Transportation (Medical): No    Lack of Transportation (Non-Medical): No  Physical Activity: Insufficiently Active (06/07/2023)   Received from [facility]   Exercise Vital Sign    On average, how many days per week do you engage in moderate to strenuous exercise (like a brisk walk)?: 3 days    On average, how many minutes do you engage in exercise at this level?: 30 min  Stress: No Stress Concern Present (06/07/2023)   Received from [facility] - Occupational Stress Questionnaire    Feeling of Stress : Not at all  Recent Concern: Stress - Stress Concern Present (03/12/2023)   Received from [facility]   [employer] [facility] - Occupational Stress Questionnaire    Feeling of Stress : To some extent  Social Connections: Socially Integrated (06/07/2023)   Received from [facility]   Social Network    How would you rate your social network (family, work, friends)?: Good participation with social networks  Intimate Partner Violence: Not At Risk (06/07/2023)   Received from [facility]   HITS    Over the last 12 months how often did  your partner physically hurt you?: Never    Over the last 12 months how often did your partner insult you or talk down to you?: Never    Over the last 12 months how often did your partner threaten you with physical harm?: Never    Over the last 12 months how often did your partner scream or curse at you?: Never    Physical Exam: Vital signs in last 24 hours: @BP  133/73   Pulse 74   Temp 98.2 F (36.8 C) (Temporal)   Resp 19   Ht 6' 1 (1.854 m)   Wt 251 lb (113.9 kg)   SpO2 98%   BMI 33.12 kg/m  GEN: NAD EYE: Sclerae anicteric ENT: MMM CV: Non-tachycardic Pulm: CTA b/l GI: Soft, NT/ND NEURO:  Alert & Oriented x 3   Gordy Starch, MD [facility]  12/21/2023 10:04 AM

## 2023-12-21 NOTE — Progress Notes (Signed)
 Report to PACU, RN, vss, BBS= Clear.

## 2023-12-21 NOTE — Progress Notes (Signed)
 Called to room to assist during endoscopic procedure.  Patient ID and intended procedure confirmed with present staff. Received instructions for my participation in the procedure from the performing physician.

## 2023-12-21 NOTE — Op Note (Signed)
 [facility] Patient Name: Kyle [name] Procedure Date: 12/21/2023 10:01 AM MRN: [id number] Endoscopist: Gordy CHRISTELLA Starch , MD, [id number] Age: 53 Referring MD:  Date of Birth: 1971/01/11 Gender: Male Account #: [id number] Procedure:                Colonoscopy Indications:              Colon cancer screening in patient at increased                            risk: Family history of colon polyps in multiple                            1st-degree relatives, Last colonoscopy: June 2020;                            hx of AIN-1 in 2020 at colonoscopy s/p treatment Medicines:                Monitored Anesthesia Care Procedure:                Pre-Anesthesia Assessment:                           - Prior to the procedure, a History and Physical                            was performed, and patient medications and                            allergies were reviewed. The patient's tolerance of                            previous anesthesia was also reviewed. The risks                            and benefits of the procedure and the sedation                            options and risks were discussed with the patient.                            All questions were answered, and informed consent                            was obtained. Prior Anticoagulants: The patient has                            taken no anticoagulant or antiplatelet agents. ASA                            Grade Assessment: II - A patient with mild systemic                            disease. After reviewing the risks and benefits,  the patient was deemed in satisfactory condition to                            undergo the procedure.                           After obtaining informed consent, the colonoscope                            was passed under direct vision. Throughout the                            procedure, the patient's blood pressure, pulse, and                            oxygen  saturations were monitored continuously. The                            Olympus Scope SN: [id number] was introduced through                            the anus and advanced to the terminal ileum. The                            colonoscopy was performed without difficulty. The                            patient tolerated the procedure well. The quality                            of the bowel preparation was good. The terminal                            ileum, ileocecal valve, appendiceal orifice, and                            rectum were photographed. Scope In: 10:17:10 AM Scope Out: 10:32:45 AM Scope Withdrawal Time: 0 hours 13 minutes 55 seconds  Total Procedure Duration: 0 hours 15 minutes 35 seconds  Findings:                 The digital rectal exam was normal.                           The terminal ileum appeared normal.                           Two sessile polyps were found in the transverse                            colon. The polyps were 4 to 5 mm in size. These                            polyps were removed with a cold snare. Resection  and retrieval were complete.                           Multiple medium-mouthed and small-mouthed                            diverticula were found in the sigmoid colon,                            descending colon and transverse colon.                           The retroflexed view of the distal rectum and anal                            verge was normal and showed no anal or rectal                            abnormalities. Complications:            No immediate complications. Estimated Blood Loss:     Estimated blood loss was minimal. Impression:               - The examined portion of the ileum was normal.                           - Two 4 to 5 mm polyps in the transverse colon,                            removed with a cold snare. Resected and retrieved.                           - Moderate diverticulosis in the sigmoid  colon, in                            the descending colon and in the transverse colon.                           - The distal rectum and anal verge are normal on                            retroflexion view. No evidence of AIN today. Recommendation:           - Patient has a contact number available for                            emergencies. The signs and symptoms of potential                            delayed complications were discussed with the                            patient. Return to normal activities tomorrow.  Written discharge instructions were provided to the                            patient.                           - Resume previous diet.                           - Continue present medications.                           - Await pathology results.                           - Repeat colonoscopy in 5 years for surveillance. Gordy CHRISTELLA Starch, MD 12/21/2023 10:41:06 AM This report has been signed electronically.

## 2023-12-21 NOTE — Patient Instructions (Signed)
 Resume previous diet  Continue present medications. Increase pantoprazole  to 40mg  BID-AC X4 weeks then return to 40mg  once daily.  Await pathology results  Repeat colonoscopy in 5 years  See handouts on polyps, diverticulosis and gastritis  YOU HAD AN ENDOSCOPIC PROCEDURE TODAY AT THE  ENDOSCOPY CENTER:   Refer to the procedure report that was given to you for any specific questions about what was found during the examination.  If the procedure report does not answer your questions, please call your gastroenterologist to clarify.  If you requested that your care partner not be given the details of your procedure findings, then the procedure report has been included in a sealed envelope for you to review at your convenience later.  YOU SHOULD EXPECT: Some feelings of bloating in the abdomen. Passage of more gas than usual.  Walking can help get rid of the air that was put into your GI tract during the procedure and reduce the bloating. If you had a lower endoscopy (such as a colonoscopy or flexible sigmoidoscopy) you may notice spotting of blood in your stool or on the toilet paper. If you underwent a bowel prep for your procedure, you may not have a normal bowel movement for a few days.  Please Note:  You might notice some irritation and congestion in your nose or some drainage.  This is from the oxygen used during your procedure.  There is no need for concern and it should clear up in a day or so.  SYMPTOMS TO REPORT IMMEDIATELY: Following lower endoscopy (colonoscopy or flexible sigmoidoscopy):  Excessive amounts of blood in the stool  Significant tenderness or worsening of abdominal pains  Swelling of the abdomen that is new, acute  Fever of 100F or higher Following upper endoscopy (EGD)  Vomiting of blood or coffee ground material  New chest pain or pain under the shoulder blades  Painful or persistently difficult swallowing  New shortness of breath  Black, tarry-looking  stools  For urgent or emergent issues, a gastroenterologist can be reached at any hour by calling [phone]. Do not use MyChart messaging for urgent concerns.   DIET:  We do recommend a small meal at first, but then you may proceed to your regular diet.  Drink plenty of fluids but you should avoid alcoholic beverages for 24 hours.  ACTIVITY:  You should plan to take it easy for the rest of today and you should NOT DRIVE or use heavy machinery until tomorrow (because of the sedation medicines used during the test).    FOLLOW UP: Our staff will call the number listed on your records the next business day following your procedure.  We will call around 7:15- 8:00 am to check on you and address any questions or concerns that you may have regarding the information given to you following your procedure. If we do not reach you, we will leave a message.     If any biopsies were taken you will be contacted by phone or by letter within the next 1-3 weeks.  Please call us  at [phone] if you have not heard about the biopsies in 3 weeks.   SIGNATURES/CONFIDENTIALITY: You and/or your care partner have signed paperwork which will be entered into your electronic medical record.  These signatures attest to the fact that that the information above on your After Visit Summary has been reviewed and is understood.  Full responsibility of the confidentiality of this discharge information lies with you and/or your care-partner.

## 2023-12-21 NOTE — Op Note (Signed)
 [facility] Patient Name: Kyle [name] Procedure Date: 12/21/2023 10:02 AM MRN: [id number] Endoscopist: Gordy CHRISTELLA Starch , MD, [id number] Age: 53 Referring MD:  Date of Birth: November 21, 1970 Gender: Male Account #: [id number] Procedure:                Upper GI endoscopy Indications:              Gastro-esophageal reflux disease; history of LA                            grade A esophagitis in 2020, currently on                            pantoprazole  40 mg once daily. Medicines:                Monitored Anesthesia Care Procedure:                Pre-Anesthesia Assessment:                           - Prior to the procedure, a History and Physical                            was performed, and patient medications and                            allergies were reviewed. The patient's tolerance of                            previous anesthesia was also reviewed. The risks                            and benefits of the procedure and the sedation                            options and risks were discussed with the patient.                            All questions were answered, and informed consent                            was obtained. Prior Anticoagulants: The patient has                            taken no anticoagulant or antiplatelet agents. ASA                            Grade Assessment: II - A patient with mild systemic                            disease. After reviewing the risks and benefits,                            the patient was deemed in satisfactory condition to  undergo the procedure.                           After obtaining informed consent, the endoscope was                            passed under direct vision. Throughout the                            procedure, the patient's blood pressure, pulse, and                            oxygen saturations were monitored continuously. The                            GIF [id number] #[id number] was introduced  through the                            mouth, and advanced to the second part of duodenum.                            The upper GI endoscopy was accomplished without                            difficulty. The patient tolerated the procedure                            well. Scope In: Scope Out: Findings:                 The examined esophagus was normal. Z-line is                            regular at 43 cm from incisors. No Barrett's                            esophagus.                           Moderate inflammation characterized by erosions,                            erythema and granularity was found in the gastric                            antrum and in the prepyloric region of the stomach.                            Biopsies were taken with a cold forceps for                            Helicobacter pylori testing.                           The cardia and gastric fundus were normal on  retroflexion.                           The examined duodenum was normal. Complications:            No immediate complications. Estimated Blood Loss:     Estimated blood loss: none. Impression:               - Normal esophagus.                           - Acute antral gastritis with erosions. Biopsied.                            Query NSAID/steroid related, but will exclude H.                            Pylori given history of in 2020.                           - Normal examined duodenum. Recommendation:           - Patient has a contact number available for                            emergencies. The signs and symptoms of potential                            delayed complications were discussed with the                            patient. Return to normal activities tomorrow.                            Written discharge instructions were provided to the                            patient.                           - Resume previous diet.                           -  Continue present medications. Increase                            pantoprazole  to 40 mg BID-AC x 4 weeks given                            gastritis and then return to 40 mg once daily. A                            second daily dose of 40 mg before dinner can be                            used if needed for breakthrough heartburn or GERD  flare.                           - Await pathology results. Gordy CHRISTELLA Starch, MD 12/21/2023 10:38:24 AM This report has been signed electronically.

## 2023-12-22 ENCOUNTER — Telehealth: Payer: Self-pay | Admitting: *Deleted

## 2023-12-22 NOTE — Telephone Encounter (Signed)
 Post procedure follow up call placed, no answer and left VM.

## 2023-12-23 LAB — SURGICAL PATHOLOGY

## 2023-12-24 ENCOUNTER — Ambulatory Visit: Payer: Self-pay | Admitting: Internal Medicine

## 2024-01-12 ENCOUNTER — Ambulatory Visit: Admitting: Family Medicine

## 2024-01-29 NOTE — Progress Notes (Unsigned)
 [facility] [address] [place] [address] Phone: [phone] Subjective:    I'm seeing this patient by the request  of:  Lemon Lamar Flavors, MD  CC: Back and neck pain follow-up  YEP:Dlagzrupcz  Kyle [name] is a 53 y.o. male coming in with complaint of back and neck pain. OMT on 11/17/2023. Patient states that he has been having increase in upper back pain.   R hip pain increasing. Would like injection today.   Medications patient has been prescribed: Zofran   Taking:    Severe OA of the lumbar spine at L5-S1.     Reviewed prior external information including notes and imaging from previsou exam, outside providers and external EMR if available.   As well as notes that were available from care everywhere and other healthcare systems.  Past medical history, social, surgical and family history all reviewed in electronic medical record.  No pertanent information unless stated regarding to the chief complaint.   Past Medical History:  Diagnosis Date   Allergy    Arthritis    Asthma    Cataract    small right eye    Constipation    Diverticulosis    Family history of colonic polyps    dad and sister    GERD (gastroesophageal reflux disease)    Neuromuscular disorder (HCC)    spine   Neuropathy of foot    right and mild    Allergies  Allergen Reactions   Penicillins Swelling    Neck swelling    Azithromycin Other (See Comments)    Possible elevated LFTs Elevated liver enzymes Possible elevated LFTs    Cyclosporine Hives   Latex Other (See Comments)    EKG leads make my skin come off   Meperidine Hives   Levaquin  [Levofloxacin ] Other (See Comments)    Muscle pains     Review of Systems:  No headache, visual changes, nausea, vomiting, diarrhea, constipation, dizziness, abdominal pain, skin rash, fevers, chills, night sweats, weight loss, swollen lymph nodes, body aches, joint swelling, chest pain, shortness of  breath, mood changes. POSITIVE muscle aches  Objective  Blood pressure (!) 96/58, pulse 71, height 6' 1 (1.854 m), weight 258 lb (117 kg), SpO2 98%.   General: No apparent distress alert and oriented x3 mood and affect normal, dressed appropriately.  HEENT: Pupils equal, extraocular movements intact  Respiratory: Patient's speak in full sentences and does not appear short of breath  Cardiovascular: No lower extremity edema, non tender, no erythema  Gait MSK:  Back does have loss of lordosis.  Continue to try to be active but does have more tightness noted in the thoracolumbar juncture in the cervical thoracic area. Has some mild swelling noted.  Osteopathic findings  C2 flexed rotated and side bent right C6 flexed rotated and side bent left T3 extended rotated and side bent right inhaled rib T9 extended rotated and side bent right L2 flexed rotated and side bent right L4 flexed rotated right side bent right Sacrum right on right   Procedure: Real-time Ultrasound Guided Injection of piriformis tendon sheath Device: GE Logiq Q7 Ultrasound guided injection is preferred based studies that show increased duration, increased effect, greater accuracy, decreased procedural pain, increased response rate, and decreased cost with ultrasound guided versus blind injection.  Verbal informed consent obtained.  Time-out conducted.  Noted no overlying erythema, induration, or other signs of local infection.  Skin prepped in a sterile fashion.  Local anesthesia: Topical Ethyl  chloride.  With sterile technique and under real time ultrasound guidance: With a 21-gauge 2 inch needle injected with 1 cc of 0.5% Marcaine and 1 cc of Kenalog  40 mg/mL. Completed without difficulty  Pain immediately resolved suggesting accurate placement of the medication.  Advised to call if fevers/chills, erythema, induration, drainage, or persistent bleeding.  Impression: Technically successful ultrasound guided  injection. Right piriformis significantly tighter than usual.    Assessment and Plan:  Piriformis syndrome, right Patient injection and tolerated procedure well, discussed icing regimen and home exercises, discussed which activities to do and which ones to avoid.  Increase activity slowly.  Discussed icing regimen.  Follow-up again in 6 to 12 weeks  Lumbar radiculopathy Stable, responded well to osteopathic manipulation.  I do think that there is been some increasing in tightness recently secondary to home exercises and icing regimen.  Increase activity slowly.  Follow-up again in 6 to 12 weeks.    Nonallopathic problems  Decision today to treat with OMT was based on Physical Exam  After verbal consent patient was treated with HVLA, ME, FPR techniques in cervical, rib, thoracic, lumbar, and sacral  areas avoided HVLA on the cervical spine.  Patient tolerated the procedure well with improvement in symptoms  Patient given exercises, stretches and lifestyle modifications  See medications in patient instructions if given  Patient will follow up in 4-8 weeks     The above documentation has been reviewed and is accurate and complete Kyle [name] M Lyzette Reinhardt, DO         Note: This dictation was prepared with Dragon dictation along with smaller phrase technology. Any transcriptional errors that result from this process are unintentional.

## 2024-02-03 ENCOUNTER — Other Ambulatory Visit: Payer: Self-pay

## 2024-02-03 ENCOUNTER — Ambulatory Visit: Admitting: Family Medicine

## 2024-02-03 VITALS — BP 96/58 | HR 71 | Ht 73.0 in | Wt 258.0 lb

## 2024-02-03 DIAGNOSIS — G5701 Lesion of sciatic nerve, right lower limb: Secondary | ICD-10-CM | POA: Diagnosis not present

## 2024-02-03 DIAGNOSIS — M9902 Segmental and somatic dysfunction of thoracic region: Secondary | ICD-10-CM | POA: Diagnosis not present

## 2024-02-03 DIAGNOSIS — M9908 Segmental and somatic dysfunction of rib cage: Secondary | ICD-10-CM | POA: Diagnosis not present

## 2024-02-03 DIAGNOSIS — M25551 Pain in right hip: Secondary | ICD-10-CM

## 2024-02-03 DIAGNOSIS — M9901 Segmental and somatic dysfunction of cervical region: Secondary | ICD-10-CM | POA: Diagnosis not present

## 2024-02-03 DIAGNOSIS — M9903 Segmental and somatic dysfunction of lumbar region: Secondary | ICD-10-CM

## 2024-02-03 DIAGNOSIS — M5416 Radiculopathy, lumbar region: Secondary | ICD-10-CM

## 2024-02-03 DIAGNOSIS — M9904 Segmental and somatic dysfunction of sacral region: Secondary | ICD-10-CM | POA: Diagnosis not present

## 2024-02-03 NOTE — Assessment & Plan Note (Signed)
 Patient injection and tolerated procedure well, discussed icing regimen and home exercises, discussed which activities to do and which ones to avoid.  Increase activity slowly.  Discussed icing regimen.  Follow-up again in 6 to 12 weeks

## 2024-02-03 NOTE — Assessment & Plan Note (Signed)
 Stable, responded well to osteopathic manipulation.  I do think that there is been some increasing in tightness recently secondary to home exercises and icing regimen.  Increase activity slowly.  Follow-up again in 6 to 12 weeks.

## 2024-02-07 ENCOUNTER — Other Ambulatory Visit: Payer: Self-pay | Admitting: Family Medicine

## 2024-02-09 ENCOUNTER — Ambulatory Visit: Admitting: Family Medicine

## 2024-04-01 NOTE — Progress Notes (Signed)
 " [facility] [address] [place] [address] Phone: [phone] Subjective:   LILLETTE Berwyn Posey, am serving as a scribe for Dr. Arthea Claudene.  I'm seeing this patient by the request  of:  Lemon Lamar Flavors, MD  CC: Back and neck pain follow-up  YEP:Dlagzrupcz  SANTHIAGO COLLINGSWORTH is a 54 y.o. male coming in with complaint of back and neck pain. OMT on 02/03/2024. Patient states continues to have some back and neck pain.  Also some ankle pain.  Nothing severe enough stopping and from activity.  Medications patient has been prescribed:   Taking:         Reviewed prior external information including notes and imaging from previsou exam, outside providers and external EMR if available.   As well as notes that were available from care everywhere and other healthcare systems.  Past medical history, social, surgical and family history all reviewed in electronic medical record.  No pertanent information unless stated regarding to the chief complaint.   Past Medical History:  Diagnosis Date   Allergy    Arthritis    Asthma    Cataract    small right eye    Constipation    Diverticulosis    Family history of colonic polyps    dad and sister    GERD (gastroesophageal reflux disease)    Neuromuscular disorder (HCC)    spine   Neuropathy of foot    right and mild    Allergies[1]   Review of Systems:  No headache, visual changes, nausea, vomiting, diarrhea, constipation, dizziness, abdominal pain, skin rash, fevers, chills, night sweats, weight loss, swollen lymph nodes, body aches, joint swelling, chest pain, shortness of breath, mood changes. POSITIVE muscle aches  Objective  There were no vitals taken for this visit.   General: No apparent distress alert and oriented x3 mood and affect normal, dressed appropriately.  HEENT: Pupils equal, extraocular movements intact  Respiratory: Patient's speak in full sentences and does not appear short  of breath  Cardiovascular: No lower extremity edema, non tender, no erythema  Gait MSK:  Back does have some loss of lordosis.  Some tenderness to palpation in the paraspinal musculature.  Right greater than left.  Osteopathic findings  C6 flexed rotated and side bent left T3 extended rotated and side bent right inhaled rib T6 extended rotated and side bent left L1 flexed rotated and side bent right Sacrum right on right       Assessment and Plan:  No problem-specific Assessment & Plan notes found for this encounter.    Nonallopathic problems  Decision today to treat with OMT was based on Physical Exam  After verbal consent patient was treated with HVLA, ME, FPR techniques in cervical, rib, thoracic, lumbar, and sacral  areas avoided HVLA on the neck.  Patient tolerated the procedure well with improvement in symptoms  Patient given exercises, stretches and lifestyle modifications  See medications in patient instructions if given  Patient will follow up in 4-8 weeks      The above documentation has been reviewed and is accurate and complete Reverie Vaquera M Pasco Marchitto, DO        Note: This dictation was prepared with Dragon dictation along with smaller phrase technology. Any transcriptional errors that result from this process are unintentional.            [1]  Allergies Allergen Reactions   Penicillins Swelling    Neck swelling    Azithromycin Other (  See Comments)    Possible elevated LFTs Elevated liver enzymes Possible elevated LFTs    Cyclosporine Hives   Latex Other (See Comments)    EKG leads make my skin come off   Meperidine Hives   Levaquin  [Levofloxacin ] Other (See Comments)    Muscle pains   "

## 2024-04-05 ENCOUNTER — Ambulatory Visit: Admitting: Family Medicine

## 2024-04-05 ENCOUNTER — Encounter: Payer: Self-pay | Admitting: Family Medicine

## 2024-04-05 VITALS — BP 112/52 | HR 75 | Ht 73.0 in

## 2024-04-05 DIAGNOSIS — M9901 Segmental and somatic dysfunction of cervical region: Secondary | ICD-10-CM | POA: Diagnosis not present

## 2024-04-05 DIAGNOSIS — M9902 Segmental and somatic dysfunction of thoracic region: Secondary | ICD-10-CM

## 2024-04-05 DIAGNOSIS — M9904 Segmental and somatic dysfunction of sacral region: Secondary | ICD-10-CM | POA: Diagnosis not present

## 2024-04-05 DIAGNOSIS — M9903 Segmental and somatic dysfunction of lumbar region: Secondary | ICD-10-CM | POA: Diagnosis not present

## 2024-04-05 DIAGNOSIS — M5416 Radiculopathy, lumbar region: Secondary | ICD-10-CM

## 2024-04-05 DIAGNOSIS — M9908 Segmental and somatic dysfunction of rib cage: Secondary | ICD-10-CM

## 2024-04-05 MED ORDER — CYCLOBENZAPRINE HCL 5 MG PO TABS: 5.0000 mg | ORAL_TABLET | Freq: Three times a day (TID) | ORAL | 0 refills | Status: AC | PRN

## 2024-04-05 NOTE — Assessment & Plan Note (Addendum)
 Chronic problem, discussed icing regimen and home exercises, discussed which activities to do and which ones to avoid.  Increase activity slowly.  Follow-up again in 6 to 8 weeks cyclobenzaprine  prescribed as well

## 2024-04-05 NOTE — Patient Instructions (Addendum)
 Refill flexeril  See me in 6-8 weeks

## 2024-05-31 ENCOUNTER — Ambulatory Visit: Admitting: Family Medicine
